# Patient Record
Sex: Female | Born: 1990 | Race: White | Hispanic: No | Smoking: Current every day smoker
Health system: Southern US, Community
[De-identification: ages and names within clinical notes are randomized; demographics above are authoritative.]

## PROBLEM LIST (undated history)

## (undated) DIAGNOSIS — O24419 Gestational diabetes mellitus in pregnancy, unspecified control: Secondary | ICD-10-CM

## (undated) DIAGNOSIS — Z87440 Personal history of urinary (tract) infections: Secondary | ICD-10-CM

## (undated) HISTORY — PX: OTHER SURGICAL HISTORY: SHX169

## (undated) HISTORY — DX: Personal history of urinary (tract) infections: Z87.440

## (undated) HISTORY — DX: Gestational diabetes mellitus in pregnancy, unspecified control: O24.419

---

## 2012-05-09 ENCOUNTER — Emergency Department: Payer: Self-pay | Admitting: *Deleted

## 2012-05-09 LAB — URINALYSIS, COMPLETE
Blood: NEGATIVE
Glucose,UR: NEGATIVE mg/dL (ref 0–75)
Ph: 5 (ref 4.5–8.0)
Protein: NEGATIVE
RBC,UR: 1 /HPF (ref 0–5)
Specific Gravity: 1.029 (ref 1.003–1.030)
Squamous Epithelial: 11

## 2012-05-09 LAB — PREGNANCY, URINE: Pregnancy Test, Urine: NEGATIVE m[IU]/mL

## 2012-05-09 LAB — CBC
MCHC: 33.9 g/dL (ref 32.0–36.0)
MCV: 85 fL (ref 80–100)
RDW: 12.9 % (ref 11.5–14.5)

## 2012-05-09 LAB — COMPREHENSIVE METABOLIC PANEL
Albumin: 3.9 g/dL (ref 3.4–5.0)
BUN: 12 mg/dL (ref 7–18)
Bilirubin,Total: 0.2 mg/dL (ref 0.2–1.0)
Calcium, Total: 8.8 mg/dL (ref 8.5–10.1)
Chloride: 105 mmol/L (ref 98–107)
Creatinine: 0.81 mg/dL (ref 0.60–1.30)
EGFR (African American): >60
Glucose: 74 mg/dL (ref 65–99)
Osmolality: 280 (ref 275–301)
SGPT (ALT): 17 U/L
Sodium: 141 mmol/L (ref 136–145)

## 2013-04-13 ENCOUNTER — Emergency Department: Payer: Self-pay | Admitting: Emergency Medicine

## 2013-04-13 LAB — CBC
HCT: 40.5 % (ref 35.0–47.0)
HGB: 14 g/dL (ref 12.0–16.0)
MCHC: 34.6 g/dL (ref 32.0–36.0)
Platelet: 210 10*3/uL (ref 150–440)
RBC: 4.73 10*6/uL (ref 3.80–5.20)
RDW: 13.4 % (ref 11.5–14.5)
WBC: 9.4 10*3/uL (ref 3.6–11.0)

## 2013-04-13 LAB — COMPREHENSIVE METABOLIC PANEL
Albumin: 3.9 g/dL (ref 3.4–5.0)
Anion Gap: 7 (ref 7–16)
BUN: 10 mg/dL (ref 7–18)
Bilirubin,Total: 0.4 mg/dL (ref 0.2–1.0)
Chloride: 106 mmol/L (ref 98–107)
Co2: 26 mmol/L (ref 21–32)
EGFR (African American): >60
EGFR (Non-African Amer.): >60
Glucose: 94 mg/dL (ref 65–99)
Osmolality: 276 (ref 275–301)
SGOT(AST): 11 U/L — ABNORMAL LOW (ref 15–37)
Total Protein: 7.6 g/dL (ref 6.4–8.2)

## 2013-04-13 LAB — URINALYSIS, COMPLETE
Bilirubin,UR: NEGATIVE
Ketone: NEGATIVE
Nitrite: NEGATIVE
RBC,UR: 2 /HPF (ref 0–5)
Squamous Epithelial: 8

## 2013-06-26 IMAGING — CR DG CHEST 2V
1 series · 2 of 2 positions shown · non-contrast
Comparison: none

REASON FOR EXAM: cough, wheezing
COMMENTS:

PROCEDURE:     DXR - DXR CHEST PA (OR AP) AND LATERAL  - May 09, 2012  [DATE]
RESULT:     Comparison: None

[Series 1: w chest pa · 0.14mm/px · 2 of 2 slices shown]
[im 1/2]
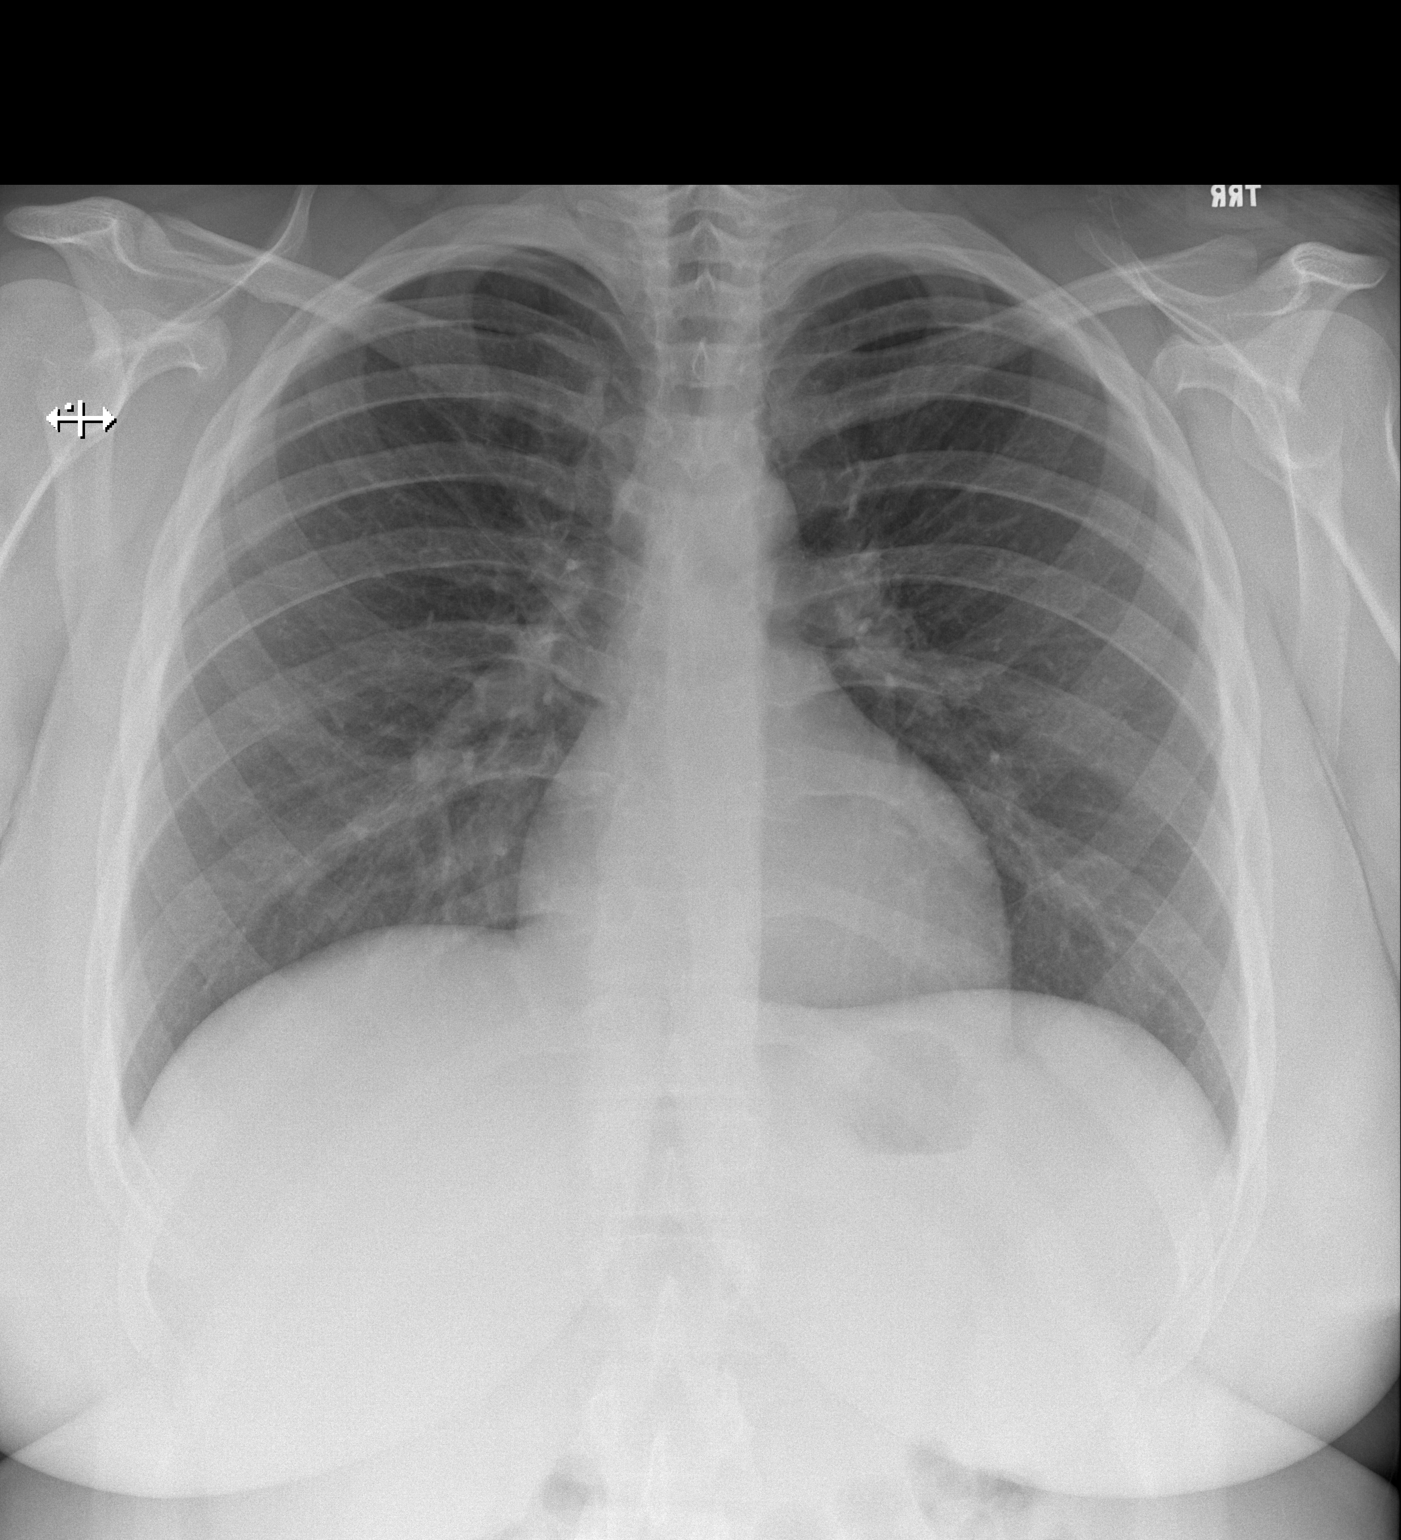
[im 2/2]
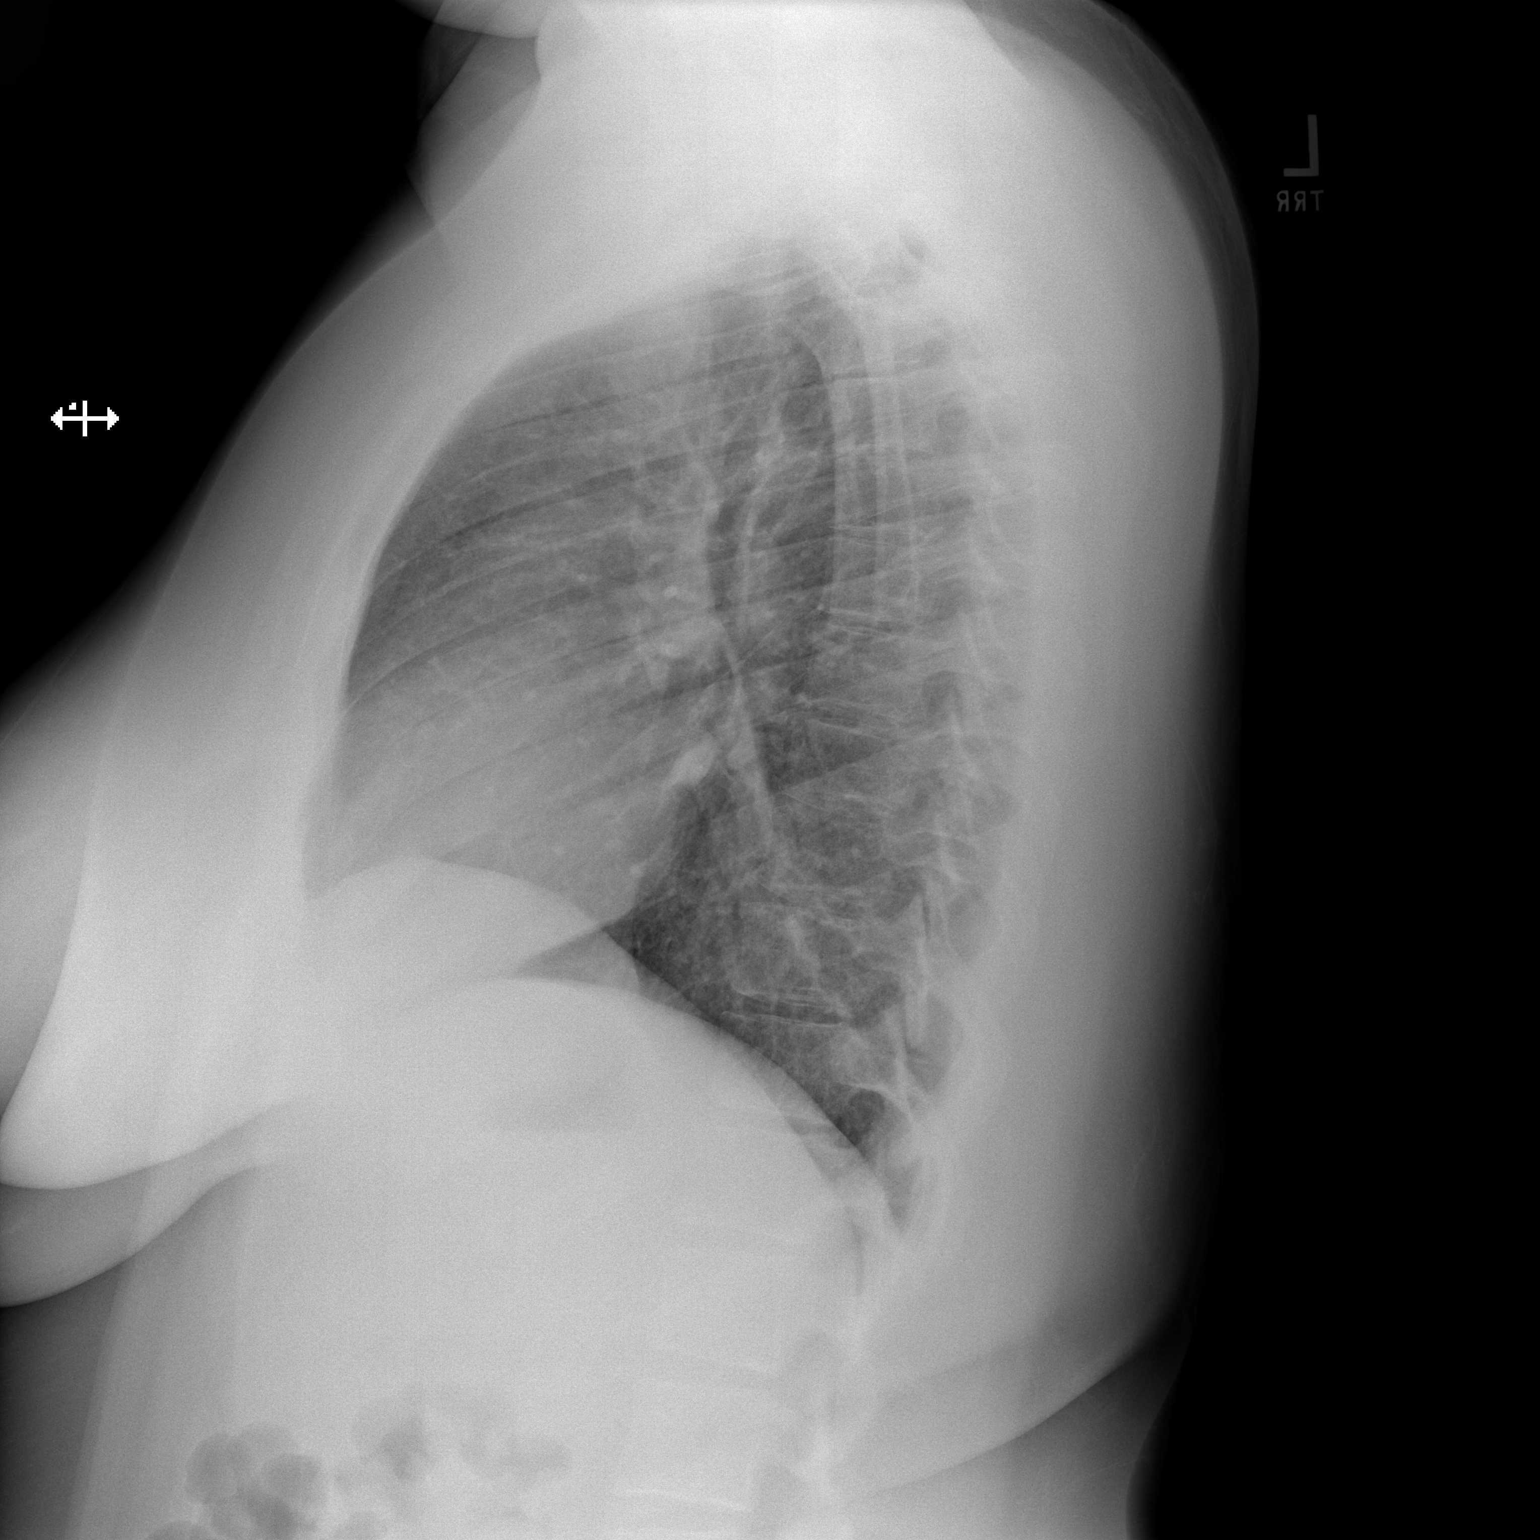

[2 of 2 positions shown; findings below may reference images not displayed]

FINDINGS: PA and lateral chest radiographs are provided.  There is no focal
parenchymal opacity, pleural effusion, or pneumothorax. The heart and
mediastinum are unremarkable.  The osseous structures are unremarkable.
IMPRESSION: No acute disease of the che[REDACTED]

## 2019-11-30 ENCOUNTER — Emergency Department
Admission: EM | Admit: 2019-11-30 | Discharge: 2019-11-30 | Disposition: A | Discharge status: Home / Self Care | Payer: Self-pay | Attending: Emergency Medicine | Admitting: Emergency Medicine

## 2019-11-30 ENCOUNTER — Other Ambulatory Visit: Payer: Self-pay

## 2019-11-30 ENCOUNTER — Encounter: Payer: Self-pay | Admitting: Emergency Medicine

## 2019-11-30 ENCOUNTER — Emergency Department: Payer: Self-pay

## 2019-11-30 DIAGNOSIS — F17210 Nicotine dependence, cigarettes, uncomplicated: Secondary | ICD-10-CM | POA: Insufficient documentation

## 2019-11-30 DIAGNOSIS — M25561 Pain in right knee: Secondary | ICD-10-CM

## 2019-11-30 MED ORDER — KETOROLAC TROMETHAMINE 30 MG/ML IJ SOLN
30.0000 mg | Freq: Once | INTRAMUSCULAR | Status: AC
  Administered 2019-11-30: 30 mg via INTRAMUSCULAR
  Filled 2019-11-30: qty 1

## 2019-11-30 MED ORDER — IBUPROFEN 600 MG PO TABS: 600.0000 mg | ORAL_TABLET | Freq: Four times a day (QID) | ORAL | 0 refills | Status: DC | PRN

## 2019-11-30 NOTE — ED Notes (Signed)
No reaction noted to the injection site. 

## 2019-11-30 NOTE — ED Notes (Signed)
Pt states she is a smoker and high bp runs in the family, but she does not take bp meds.

## 2019-11-30 NOTE — ED Notes (Signed)
Pt states she fell on right  knee years ago but this year it is often swelling, now right knee has been swollen for a month. No redness noted, some swelling noted to right knee. Pt able to walk and move freely in room

## 2019-11-30 NOTE — ED Provider Notes (Signed)
[facility] Emergency Department Provider Note  ____________________________________________  Time seen: Approximately 1:28 PM  I have reviewed the triage vital signs and the nursing notes.   HISTORY  Chief Complaint Knee Pain    HPI Kirsten Hurley is a 28 y.o. female that presents to the emergency department for evaluation of right knee pain for about 1 month.  Patient states that she has been on her feet more than usual for the last month due to the holidays.  Patient works in [employer] and is on her feet at work all day.  Patient has not followed up with anyone prior to now because she has had difficulty getting off work to make an appointment somewhere.  She was able to convince her manager to give her off today to come to the ER to be evaluated.  No trauma.  History reviewed. No pertinent past medical history.  There are no problems to display for this patient.   History reviewed. No pertinent surgical history.  Prior to Admission medications   Medication Sig Start Date End Date Taking? Authorizing Provider  ibuprofen (ADVIL) 600 MG tablet Take 1 tablet (600 mg total) by mouth every 6 (six) hours as needed. 11/30/19   Enid Derry, PA-C    Allergies Patient has no known allergies.  No family history on file.  Social History Social History   Tobacco Use  . Smoking status: Current Every Day Smoker    Types: Cigarettes  . Smokeless tobacco: Never Used  Substance Use Topics  . Alcohol use: Not on file  . Drug use: Not on file     Review of Systems  Constitutional: No fever/chills Gastrointestinal:  No nausea, no vomiting.  Musculoskeletal: Positive for knee pain. Skin: Negative for rash, abrasions, lacerations, ecchymosis. Neurological: Negative for numbness or tingling   ____________________________________________   PHYSICAL EXAM:  VITAL SIGNS: ED Triage Vitals  Enc Vitals Group     BP 11/30/19 1317 (!) 149/96     Pulse Rate  11/30/19 1317 (!) 58     Resp 11/30/19 1317 16     Temp 11/30/19 1317 98.3 F (36.8 C)     Temp Source 11/30/19 1317 Oral     SpO2 11/30/19 1317 100 %     Weight 11/30/19 1234 180 lb (81.6 kg)     Height 11/30/19 1234 5\' 5"  (1.651 m)     Head Circumference --      Peak Flow --      Pain Score 11/30/19 1234 8     Pain Loc --      Pain Edu? --      Excl. in [place]? --      Constitutional: Alert and oriented. Well appearing and in no acute distress. Eyes: Conjunctivae are normal. PERRL. EOMI. Head: Atraumatic. ENT:      Ears:      Nose: No congestion/rhinnorhea.      Mouth/Throat: Mucous membranes are moist.  Neck: No stridor.  Cardiovascular: Normal rate, regular rhythm.  Good peripheral circulation. Respiratory: Normal respiratory effort without tachypnea or retractions. Lungs CTAB. Good air entry to the bases with no decreased or absent breath sounds. Musculoskeletal: Full range of motion to all extremities. No gross deformities appreciated.  Full range of motion of right knee.  Mild swelling to anterior knee without any overlying erythema or ecchymosis. No calf tenderness. Neurologic:  Normal speech and language. No gross focal neurologic deficits are appreciated.  Skin:  Skin is warm, dry and  intact. No rash noted. Psychiatric: Mood and affect are normal. Speech and behavior are normal. Patient exhibits appropriate insight and judgement.   ____________________________________________   LABS (all labs ordered are listed, but only abnormal results are displayed)  Labs Reviewed - No data to display ____________________________________________  EKG   ____________________________________________  RADIOLOGY Robinette Haines, personally viewed and evaluated these images (plain radiographs) as part of my medical decision making, as well as reviewing the written report by the radiologist.  DG Knee Complete 4 Views Right  Result Date: 11/30/2019 CLINICAL DATA:  RIGHT knee  swelling, remote trauma EXAM: RIGHT KNEE - COMPLETE 4+ VIEW COMPARISON:  None FINDINGS: Osseous mineralization normal. Joint spaces preserved. No fracture, dislocation, or bone destruction. No joint effusion. IMPRESSION: Normal exam. Electronically Signed   By: Lavonia Dana M.D.   On: 11/30/2019 13:22    ____________________________________________    PROCEDURES  Procedure(s) performed:    Procedures    Medications  ketorolac (TORADOL) 30 MG/ML injection 30 mg (30 mg Intramuscular Given 11/30/19 1416)     ____________________________________________   INITIAL IMPRESSION / ASSESSMENT AND PLAN / ED COURSE  Pertinent labs & imaging results that were available during my care of the patient were reviewed by me and considered in my medical decision making (see chart for details).  Review of the [place] CSRS was performed in accordance of the [place] prior to dispensing any controlled drugs.   Patient presents emergency department for evaluation of right knee pain for 1 month.  Vital signs and exam are reassuring.  Knee x-ray negative for acute bony abnormalities.  IM Toradol was given for pain and inflammation.  Knee was Ace wrapped.  Patient declines Crutches.  Patient will be discharged home with prescriptions for ibuprofen. Patient is to follow up with orthopedics as directed. Patient is given ED precautions to return to the ED for any worsening or new symptoms.   Kirsten Hurley was evaluated in Emergency Department on 11/30/2019 for the symptoms described in the history of present illness. She was evaluated in the context of the global COVID-19 pandemic, which necessitated consideration that the patient might be at risk for infection with the SARS-CoV-2 virus that causes COVID-19. Institutional protocols and algorithms that pertain to the evaluation of patients at risk for COVID-19 are in a state of rapid change based on information released by regulatory bodies including the CDC and federal  and state organizations. These policies and algorithms were followed during the patient's care in the ED.  ____________________________________________  FINAL CLINICAL IMPRESSION(S) / ED DIAGNOSES  Final diagnoses:  Acute pain of right knee      NEW MEDICATIONS STARTED DURING THIS VISIT:  ED Discharge Orders         Ordered    ibuprofen (ADVIL) 600 MG tablet  Every 6 hours PRN     11/30/19 1421              This chart was dictated using voice recognition software/Dragon. Despite best efforts to proofread, errors can occur which can change the meaning. Any change was purely unintentional.    Laban Emperor, PA-C 11/30/19 1601    Duffy Bruce, MD 12/02/19 1036

## 2019-11-30 NOTE — ED Triage Notes (Signed)
Right knee pain x 1 month.  AAOx3.  Skin warm and dry.  Ambulates with easy and steady gait.  NAD

## 2019-12-05 DIAGNOSIS — Z8759 Personal history of other complications of pregnancy, childbirth and the puerperium: Secondary | ICD-10-CM

## 2019-12-05 HISTORY — DX: Personal history of other complications of pregnancy, childbirth and the puerperium: Z87.59

## 2019-12-21 ENCOUNTER — Emergency Department: Payer: Self-pay

## 2019-12-21 ENCOUNTER — Other Ambulatory Visit: Payer: Self-pay

## 2019-12-21 ENCOUNTER — Emergency Department
Admission: EM | Admit: 2019-12-21 | Discharge: 2019-12-21 | Disposition: A | Discharge status: Home / Self Care | Payer: Self-pay | Attending: Emergency Medicine | Admitting: Emergency Medicine

## 2019-12-21 ENCOUNTER — Encounter: Payer: Self-pay | Admitting: Emergency Medicine

## 2019-12-21 DIAGNOSIS — N939 Abnormal uterine and vaginal bleeding, unspecified: Secondary | ICD-10-CM

## 2019-12-21 DIAGNOSIS — R102 Pelvic and perineal pain: Secondary | ICD-10-CM | POA: Insufficient documentation

## 2019-12-21 DIAGNOSIS — F17210 Nicotine dependence, cigarettes, uncomplicated: Secondary | ICD-10-CM | POA: Insufficient documentation

## 2019-12-21 DIAGNOSIS — O039 Complete or unspecified spontaneous abortion without complication: Secondary | ICD-10-CM | POA: Insufficient documentation

## 2019-12-21 LAB — CBC WITH DIFFERENTIAL/PLATELET
Abs Immature Granulocytes: 0.04 10*3/uL (ref 0.00–0.07)
Basophils Absolute: 0 10*3/uL (ref 0.0–0.1)
Basophils Relative: 0 %
Eosinophils Absolute: 0.1 10*3/uL (ref 0.0–0.5)
Eosinophils Relative: 1 %
HCT: 33.8 % — ABNORMAL LOW (ref 36.0–46.0)
Hemoglobin: 11.2 g/dL — ABNORMAL LOW (ref 12.0–15.0)
Immature Granulocytes: 0 %
Lymphocytes Relative: 22 %
Lymphs Abs: 2.3 10*3/uL (ref 0.7–4.0)
MCH: 28.9 pg (ref 26.0–34.0)
MCHC: 33.1 g/dL (ref 30.0–36.0)
MCV: 87.3 fL (ref 80.0–100.0)
Monocytes Absolute: 0.5 10*3/uL (ref 0.1–1.0)
Monocytes Relative: 5 %
Neutro Abs: 7.3 10*3/uL (ref 1.7–7.7)
Neutrophils Relative %: 72 %
Platelets: 242 10*3/uL (ref 150–400)
RBC: 3.87 MIL/uL (ref 3.87–5.11)
RDW: 13.2 % (ref 11.5–15.5)
WBC: 10.3 10*3/uL (ref 4.0–10.5)
nRBC: 0 % (ref 0.0–0.2)

## 2019-12-21 LAB — COMPREHENSIVE METABOLIC PANEL
ALT: 15 U/L (ref 0–44)
AST: 14 U/L — ABNORMAL LOW (ref 15–41)
Albumin: 4.1 g/dL (ref 3.5–5.0)
Alkaline Phosphatase: 41 U/L (ref 38–126)
Anion gap: 6 (ref 5–15)
BUN: 17 mg/dL (ref 6–20)
CO2: 25 mmol/L (ref 22–32)
Calcium: 8.6 mg/dL — ABNORMAL LOW (ref 8.9–10.3)
Chloride: 106 mmol/L (ref 98–111)
Creatinine, Ser: 0.58 mg/dL (ref 0.44–1.00)
GFR calc Af Amer: >60 mL/min (ref >60)
GFR calc non Af Amer: >60 mL/min (ref >60)
Glucose, Bld: 102 mg/dL — ABNORMAL HIGH (ref 70–99)
Potassium: 4.2 mmol/L (ref 3.5–5.1)
Sodium: 137 mmol/L (ref 135–145)
Total Bilirubin: 0.6 mg/dL (ref 0.3–1.2)
Total Protein: 7.2 g/dL (ref 6.5–8.1)

## 2019-12-21 LAB — ABO/RH: ABO/RH(D): A NEG

## 2019-12-21 LAB — HCG, QUANTITATIVE, PREGNANCY: hCG, Beta Chain, Quant, S: 3382 m[IU]/mL — ABNORMAL HIGH (ref <5)

## 2019-12-21 NOTE — ED Notes (Signed)
Asked pt if she felt like she could provide a urine sample and she said that she could not.

## 2019-12-21 NOTE — ED Provider Notes (Signed)
[facility] Emergency Department Provider Note  ____________________________________________  Time seen: Approximately 2:13 PM  I have reviewed the triage vital signs and the nursing notes.   HISTORY  Chief Complaint Vaginal Bleeding    HPI Kirsten Hurley is a 29 y.o. female who presents the emergency department for evaluation of pelvic pain, vaginal bleeding.  Patient reports that her vaginal bleeding started December 23, almost a month ago.  Patient states that she had some light spotting at the time of her normal menstrual cycle.  This is turned into daily bleeding over the past month.  Patient states that the bleeding became heavy and she was passing large clots starting yesterday.  She has had associated lower pelvic pain.  She denies any chance of pregnancy.  No concerns for STD.  Patient denies any urinary symptoms or flank pain.  No fevers or chills.  No other complaints at this time.  No history of same.  She is not on any birth control.  No anticoagulation use.         History reviewed. No pertinent past medical history.  There are no problems to display for this patient.   History reviewed. No pertinent surgical history.  Prior to Admission medications   Medication Sig Start Date End Date Taking? Authorizing Provider  ibuprofen (ADVIL) 600 MG tablet Take 1 tablet (600 mg total) by mouth every 6 (six) hours as needed. 11/30/19   Laban Emperor, PA-C    Allergies Patient has no known allergies.  No family history on file.  Social History Social History   Tobacco Use  . Smoking status: Current Every Day Smoker    Types: Cigarettes  . Smokeless tobacco: Never Used  Substance Use Topics  . Alcohol use: Not on file  . Drug use: Not on file     Review of Systems  Constitutional: No fever/chills Eyes: No visual changes. No discharge ENT: No upper respiratory complaints. Cardiovascular: no chest pain. Respiratory: no cough. No  SOB. Gastrointestinal: No abdominal pain.  No nausea, no vomiting.  No diarrhea.  No constipation. Genitourinary: Negative for dysuria. No hematuria.  Positive for vaginal bleeding, pelvic pain Musculoskeletal: Negative for musculoskeletal pain. Skin: Negative for rash, abrasions, lacerations, ecchymosis. Neurological: Negative for headaches, focal weakness or numbness. 10-point ROS otherwise negative.  ____________________________________________   PHYSICAL EXAM:  VITAL SIGNS: ED Triage Vitals  Enc Vitals Group     BP 12/21/19 1246 (!) 143/88     Pulse Rate 12/21/19 1246 (!) 103     Resp 12/21/19 1246 16     Temp 12/21/19 1246 97.8 F (36.6 C)     Temp Source 12/21/19 1246 Oral     SpO2 12/21/19 1246 100 %     Weight 12/21/19 1247 230 lb (104.3 kg)     Height 12/21/19 1247 5\' 6"  (1.676 m)     Head Circumference --      Peak Flow --      Pain Score 12/21/19 1246 0     Pain Loc --      Pain Edu? --      Excl. in [place]? --      Constitutional: Alert and oriented. Well appearing and in no acute distress. Eyes: Conjunctivae are normal. PERRL. EOMI. Head: Atraumatic. ENT:      Ears:       Nose: No congestion/rhinnorhea.      Mouth/Throat: Mucous membranes are moist.  Neck: No stridor.    Cardiovascular: Normal rate, regular  rhythm. Normal S1 and S2.  Good peripheral circulation. Respiratory: Normal respiratory effort without tachypnea or retractions. Lungs CTAB. Good air entry to the bases with no decreased or absent breath sounds. Gastrointestinal: No visible external abdominal wall findings.  Bowel sounds 4 quadrants. Soft and nontender to palpation over the 4 quadrants.  Patient does have very mild tenderness right above the suprapubic region.. No guarding or rigidity. No palpable masses. No distention. No CVA tenderness. Musculoskeletal: Full range of motion to all extremities. No gross deformities appreciated. Neurologic:  Normal speech and language. No gross focal  neurologic deficits are appreciated.  Skin:  Skin is warm, dry and intact. No rash noted. Psychiatric: Mood and affect are normal. Speech and behavior are normal. Patient exhibits appropriate insight and judgement.   ____________________________________________   LABS (all labs ordered are listed, but only abnormal results are displayed)  Labs Reviewed  CBC WITH DIFFERENTIAL/PLATELET - Abnormal; Notable for the following components:      Result Value   Hemoglobin 11.2 (*)    HCT 33.8 (*)    All other components within normal limits  COMPREHENSIVE METABOLIC PANEL - Abnormal; Notable for the following components:   Glucose, Bld 102 (*)    Calcium 8.6 (*)    AST 14 (*)    All other components within normal limits  HCG, QUANTITATIVE, PREGNANCY - Abnormal; Notable for the following components:   hCG, Beta Chain, Quant, S 3,382 (*)    All other components within normal limits  URINALYSIS, COMPLETE (UACMP) WITH MICROSCOPIC  POC URINE PREG, ED  TYPE AND SCREEN  ABO/RH   ____________________________________________  EKG   ____________________________________________  RADIOLOGY I personally viewed and evaluated these images as part of my medical decision making, as well as reviewing the written report by the radiologist.  I discussed the case with radiologist via telephone.  Appears the patient is experiencing an active miscarriage.  US OB Comp Less 14 Wks  Result Date: 12/21/2019 CLINICAL DATA:  Pelvic pain, vaginal bleeding, positive beta HCG EXAM: OBSTETRIC <14 WK ULTRASOUND TECHNIQUE: Transabdominal ultrasound was performed for evaluation of the gestation as well as the maternal uterus and adnexal regions. COMPARISON:  None. FINDINGS: Intrauterine gestational sac: None Yolk sac:  Visualized. Embryo:  Not Visualized. Cardiac Activity: Not Visualized. Subchorionic hemorrhage:  None visualized. Maternal uterus/adnexae: The endocervical canal is dilated with extensive heterogeneous  material in the cervical canal and upper vagina. No internal vascularity is seen within this area on color Doppler. Endometrial thickness measures 4.7 mm, although evaluation is limited as only transabdominal ultrasound was performed. Right ovary measures 3.2 x 2.2 x 2.8 cm appears unremarkable. Left ovary measures 3.5 x 2.6 x 2.3 cm and appears unremarkable. No adnexal masses or free fluid. IMPRESSION: 1. No intrauterine pregnancy. 2. Dilated endocervical canal with heterogeneous debris present at the level of the cervix and upper vaginal canal. Findings suggestive of failing first-trimester pregnancy in progress. Recommend serial beta hCG and short-term follow-up ultrasound to assess for any retained products. 3. Unremarkable appearance of the ovaries and adnexal regions. These results were called by telephone at the time of interpretation on 12/21/2019 at 4:31 pm to provider [facility] , who verbally acknowledged these results. Electronically Signed   By: Duanne Guess D.O.   On: 12/21/2019 16:32    ____________________________________________    PROCEDURES  Procedure(s) performed:    Procedures    Medications - No data to display   ____________________________________________   INITIAL IMPRESSION / ASSESSMENT AND PLAN /  ED COURSE  Pertinent labs & imaging results that were available during my care of the patient were reviewed by me and considered in my medical decision making (see chart for details).  Review of the [place] CSRS was performed in accordance of the [facility] prior to dispensing any controlled drugs.           Patient's diagnosis is consistent with miscarriage.  Patient presented to emergency department complaining of vaginal bleeding times several weeks.  Differential included ovarian cyst, higher than normal menstrual cycle, uterine lesion mass, STD, pregnancy, miscarriage.  Bleeding became much heavier today and she had passed "clots" today.  Patient had slight  tenderness in the suprapubic region.  Labs were reassuring.  hCG was positive.  Ultrasound reveals evidence of active miscarriage.  I have informed patient of results, patient will need to have repeat hCG, repeat ultrasound as an outpatient.  Follow-up with OB/GYN. Patient is given ED precautions to return to the ED for any worsening or new symptoms.     ____________________________________________  FINAL CLINICAL IMPRESSION(S) / ED DIAGNOSES  Final diagnoses:  Vaginal bleeding  Pelvic pain  Miscarriage      NEW MEDICATIONS STARTED DURING THIS VISIT:  ED Discharge Orders    None          This chart was dictated using voice recognition software/Dragon. Despite best efforts to proofread, errors can occur which can change the meaning. Any change was purely unintentional.    Racheal Patches, PA-C 12/21/19 1657    Phineas Semen, MD 12/22/19 [phone]

## 2019-12-21 NOTE — ED Triage Notes (Signed)
C/O vaginal bleeding and clotting x 1 month.  AAOx3.  Skin warm and dry. NAD

## 2019-12-30 ENCOUNTER — Encounter: Payer: Self-pay | Admitting: Emergency Medicine

## 2019-12-30 ENCOUNTER — Other Ambulatory Visit: Payer: Self-pay

## 2019-12-30 ENCOUNTER — Emergency Department: Admission: EM | Admit: 2019-12-30 | Discharge: 2019-12-30 | Discharge status: Absent without leave | Payer: Self-pay

## 2019-12-30 NOTE — ED Triage Notes (Signed)
Pt reports was here last week for a miscarriage and was advised to come back today for a repeat US. Pt denies sx's currently.

## 2020-09-28 ENCOUNTER — Other Ambulatory Visit: Payer: Self-pay

## 2020-09-28 ENCOUNTER — Encounter: Payer: Self-pay | Admitting: *Deleted

## 2020-09-28 ENCOUNTER — Emergency Department
Admission: EM | Admit: 2020-09-28 | Discharge: 2020-09-28 | Disposition: A | Discharge status: Home / Self Care | Payer: Self-pay | Attending: Emergency Medicine | Admitting: Emergency Medicine

## 2020-09-28 DIAGNOSIS — F17210 Nicotine dependence, cigarettes, uncomplicated: Secondary | ICD-10-CM | POA: Insufficient documentation

## 2020-09-28 DIAGNOSIS — N970 Female infertility associated with anovulation: Secondary | ICD-10-CM | POA: Insufficient documentation

## 2020-09-28 DIAGNOSIS — B9689 Other specified bacterial agents as the cause of diseases classified elsewhere: Secondary | ICD-10-CM | POA: Insufficient documentation

## 2020-09-28 DIAGNOSIS — R5383 Other fatigue: Secondary | ICD-10-CM | POA: Insufficient documentation

## 2020-09-28 DIAGNOSIS — N760 Acute vaginitis: Secondary | ICD-10-CM | POA: Insufficient documentation

## 2020-09-28 LAB — CBC WITH DIFFERENTIAL/PLATELET
Abs Immature Granulocytes: 0.02 10*3/uL (ref 0.00–0.07)
Basophils Absolute: 0 10*3/uL (ref 0.0–0.1)
Basophils Relative: 0 %
Eosinophils Absolute: 0.2 10*3/uL (ref 0.0–0.5)
Eosinophils Relative: 2 %
HCT: 41.7 % (ref 36.0–46.0)
Hemoglobin: 13.1 g/dL (ref 12.0–15.0)
Immature Granulocytes: 0 %
Lymphocytes Relative: 24 %
Lymphs Abs: 1.7 10*3/uL (ref 0.7–4.0)
MCH: 25.1 pg — ABNORMAL LOW (ref 26.0–34.0)
MCHC: 31.4 g/dL (ref 30.0–36.0)
MCV: 80 fL (ref 80.0–100.0)
Monocytes Absolute: 0.4 10*3/uL (ref 0.1–1.0)
Monocytes Relative: 6 %
Neutro Abs: 4.9 10*3/uL (ref 1.7–7.7)
Neutrophils Relative %: 68 %
Platelets: 292 10*3/uL (ref 150–400)
RBC: 5.21 MIL/uL — ABNORMAL HIGH (ref 3.87–5.11)
RDW: 15.2 % (ref 11.5–15.5)
WBC: 7.1 10*3/uL (ref 4.0–10.5)
nRBC: 0 % (ref 0.0–0.2)

## 2020-09-28 LAB — COMPREHENSIVE METABOLIC PANEL
ALT: 15 U/L (ref 0–44)
AST: 15 U/L (ref 15–41)
Albumin: 4.5 g/dL (ref 3.5–5.0)
Alkaline Phosphatase: 51 U/L (ref 38–126)
Anion gap: 8 (ref 5–15)
BUN: 13 mg/dL (ref 6–20)
CO2: 25 mmol/L (ref 22–32)
Calcium: 9.4 mg/dL (ref 8.9–10.3)
Chloride: 103 mmol/L (ref 98–111)
Creatinine, Ser: 0.72 mg/dL (ref 0.44–1.00)
GFR, Estimated: >60 mL/min (ref >60)
Glucose, Bld: 88 mg/dL (ref 70–99)
Potassium: 4.2 mmol/L (ref 3.5–5.1)
Sodium: 136 mmol/L (ref 135–145)
Total Bilirubin: 0.6 mg/dL (ref 0.3–1.2)
Total Protein: 8.5 g/dL — ABNORMAL HIGH (ref 6.5–8.1)

## 2020-09-28 LAB — URINALYSIS, COMPLETE (UACMP) WITH MICROSCOPIC
Bilirubin Urine: NEGATIVE
Glucose, UA: NEGATIVE mg/dL
Ketones, ur: NEGATIVE mg/dL
Leukocytes,Ua: NEGATIVE
Nitrite: NEGATIVE
Protein, ur: NEGATIVE mg/dL
Specific Gravity, Urine: 1.004 — ABNORMAL LOW (ref 1.005–1.030)
pH: 6 (ref 5.0–8.0)

## 2020-09-28 LAB — WET PREP, GENITAL
Sperm: NONE SEEN
Trich, Wet Prep: NONE SEEN
Yeast Wet Prep HPF POC: NONE SEEN

## 2020-09-28 LAB — CHLAMYDIA/NGC RT PCR (ARMC ONLY)
Chlamydia Tr: NOT DETECTED
N gonorrhoeae: NOT DETECTED

## 2020-09-28 LAB — POCT PREGNANCY, URINE: Preg Test, Ur: NEGATIVE

## 2020-09-28 MED ORDER — IBUPROFEN 600 MG PO TABS: 600.0000 mg | ORAL_TABLET | Freq: Three times a day (TID) | ORAL | 0 refills | Status: DC | PRN

## 2020-09-28 MED ORDER — MEDROXYPROGESTERONE ACETATE 5 MG PO TABS: 5.0000 mg | ORAL_TABLET | Freq: Every day | ORAL | 0 refills | Status: DC

## 2020-09-28 MED ORDER — METRONIDAZOLE 500 MG PO TABS: 500.0000 mg | ORAL_TABLET | Freq: Two times a day (BID) | ORAL | 0 refills | Status: AC

## 2020-09-28 NOTE — ED Notes (Signed)
poct pregnancy Negative 

## 2020-09-28 NOTE — Discharge Instructions (Addendum)
Start the provera immediately. You should stop bleeding within 1-2 days. At the end of the 10 day course, you should have  a normal to slightly heavy period.  Take the motrin as needed for cramping  Your wet prep test did show some clue cells, which can be from bacterial vaginosis. This is a common condition that is NOT related to sexual intercourse or hygiene. Take the antibiotic as prescribed.

## 2020-09-28 NOTE — ED Provider Notes (Signed)
[facility] Emergency Department Provider Note  ____________________________________________   First MD Initiated Contact with Patient 09/28/20 1802     (approximate)  I have reviewed the triage vital signs and the nursing notes.   HISTORY  Chief Complaint Vaginal Bleeding    HPI Kirsten Hurley is a 29 y.o. female  Here with vaginal bleeding. Pt reports that over hte pat 2 months, she has had irregular periods/bleeding. She is normally very regular, every month, with 5-7 days of bleeding. She had a normal period 2 months ago, but since then has had essentially three episodes of bleeding. She had what she describes as a normal period that ended one week ago most recently. However, over the past 2 days, she has begun "spotting" again with light bleeding. She has had associated mild fatigue but no overt abdominal pain. No dysuria, hematuria. She is sexually active with males only but denies any dyspareunia, no discharge. No other complaints. Does admit to recent increased stress but no hormone treatment, no other med changes. No significant weight gain or loss.        No past medical history on file.  There are no problems to display for this patient.   No past surgical history on file.  Prior to Admission medications   Medication Sig Start Date End Date Taking? Authorizing Provider  ibuprofen (ADVIL) 600 MG tablet Take 1 tablet (600 mg total) by mouth every 8 (eight) hours as needed for moderate pain or cramping. 09/28/20   Shaune Pollack, MD  medroxyPROGESTERone (PROVERA) 5 MG tablet Take 1 tablet (5 mg total) by mouth daily for 10 days. 09/28/20 10/08/20  Shaune Pollack, MD  metroNIDAZOLE (FLAGYL) 500 MG tablet Take 1 tablet (500 mg total) by mouth 2 (two) times daily for 7 days. 09/28/20 10/05/20  Shaune Pollack, MD    Allergies Patient has no known allergies.  No family history on file.  Social History Social History   Tobacco Use  . Smoking  status: Current Every Day Smoker    Types: Cigarettes  . Smokeless tobacco: Never Used  Substance Use Topics  . Alcohol use: Yes  . Drug use: Not Currently    Review of Systems  Review of Systems  Constitutional: Negative for fatigue and fever.  HENT: Negative for congestion and sore throat.   Eyes: Negative for visual disturbance.  Respiratory: Negative for cough and shortness of breath.   Cardiovascular: Negative for chest pain.  Gastrointestinal: Negative for abdominal pain, diarrhea, nausea and vomiting.  Genitourinary: Positive for vaginal bleeding. Negative for flank pain.  Musculoskeletal: Negative for back pain and neck pain.  Skin: Negative for rash and wound.  Neurological: Negative for weakness.     ____________________________________________  PHYSICAL EXAM:      VITAL SIGNS: ED Triage Vitals [09/28/20 1517]  Enc Vitals Group     BP (!) 140/95     Pulse Rate 84     Resp 20     Temp 98.1 F (36.7 C)     Temp Source Oral     SpO2 100 %     Weight 250 lb (113.4 kg)     Height 5\' 7"  (1.702 m)     Head Circumference      Peak Flow      Pain Score 0     Pain Loc      Pain Edu?      Excl. in GC?      Physical Exam Vitals and nursing  note reviewed.  Constitutional:      General: She is not in acute distress.    Appearance: She is well-developed.  HENT:     Head: Normocephalic and atraumatic.  Eyes:     Conjunctiva/sclera: Conjunctivae normal.  Cardiovascular:     Rate and Rhythm: Normal rate and regular rhythm.     Heart sounds: Normal heart sounds. No murmur heard.  No friction rub.  Pulmonary:     Effort: Pulmonary effort is normal. No respiratory distress.     Breath sounds: Normal breath sounds. No wheezing or rales.  Abdominal:     General: There is no distension.     Palpations: Abdomen is soft.     Tenderness: There is no abdominal tenderness.  Genitourinary:    Comments: Moderate volume dark red vaginal bleeding. Os closed. No cervical  friability or discharge. No adnexal pain. Musculoskeletal:     Cervical back: Neck supple.  Skin:    General: Skin is warm.     Capillary Refill: Capillary refill takes less than 2 seconds.  Neurological:     Mental Status: She is alert and oriented to person, place, and time.     Motor: No abnormal muscle tone.       ____________________________________________   LABS (all labs ordered are listed, but only abnormal results are displayed)  Labs Reviewed  WET PREP, GENITAL - Abnormal; Notable for the following components:      Result Value   Clue Cells Wet Prep HPF POC PRESENT (*)    WBC, Wet Prep HPF POC MODERATE (*)    All other components within normal limits  CBC WITH DIFFERENTIAL/PLATELET - Abnormal; Notable for the following components:   RBC 5.21 (*)    MCH 25.1 (*)    All other components within normal limits  COMPREHENSIVE METABOLIC PANEL - Abnormal; Notable for the following components:   Total Protein 8.5 (*)    All other components within normal limits  URINALYSIS, COMPLETE (UACMP) WITH MICROSCOPIC - Abnormal; Notable for the following components:   Color, Urine STRAW (*)    APPearance HAZY (*)    Specific Gravity, Urine 1.004 (*)    Hgb urine dipstick LARGE (*)    Bacteria, UA FEW (*)    All other components within normal limits  CHLAMYDIA/NGC RT PCR ([facility] ONLY)  POC URINE PREG, ED  POCT PREGNANCY, URINE    ____________________________________________  EKG:  ________________________________________  RADIOLOGY All imaging, including plain films, CT scans, and ultrasounds, independently reviewed by me, and interpretations confirmed via formal radiology reads.  ED MD interpretation:     Official radiology report(s): No results found.  ____________________________________________  PROCEDURES   Procedure(s) performed (including Critical Care):  Procedures  ____________________________________________  INITIAL IMPRESSION / MDM /  ASSESSMENT AND PLAN / ED COURSE  As part of my medical decision making, I reviewed the following data within the electronic MEDICAL RECORD NUMBER Nursing notes reviewed and incorporated, Old chart reviewed, Notes from prior ED visits, and [place] Controlled Substance Database       *MEMORY HEINRICHS was evaluated in Emergency Department on 09/28/2020 for the symptoms described in the history of present illness. She was evaluated in the context of the global COVID-19 pandemic, which necessitated consideration that the patient might be at risk for infection with the SARS-CoV-2 virus that causes COVID-19. Institutional protocols and algorithms that pertain to the evaluation of patients at risk for COVID-19 are in a state of rapid change based on information released  by regulatory bodies including the CDC and federal and state organizations. These policies and algorithms were followed during the patient's care in the ED.  Some ED evaluations and interventions may be delayed as a result of limited staffing during the pandemic.*     Medical Decision Making:  29 yo F here with irregular vaginal bleeding. Suspect anovulatory bleeding in setting of recent stressors. Hgb is stable, pt HDS with no significant hemorrhage on exam. She is not on anticoagulants. UPT negative. UA without signs of UTI and she has no sx of UTI. Discussed management options with pt. She has tolerated OCPs in past. No h/o DVT/PE. Will place on 10 day course of Provera, refer for outpt GYN follow-up. Of note, wet prep + clue cells - will treat with Flagyl. Return precautions given. Motrin PRN cramping.  ____________________________________________  FINAL CLINICAL IMPRESSION(S) / ED DIAGNOSES  Final diagnoses:  Anovulatory bleeding  Bacterial vaginosis     MEDICATIONS GIVEN DURING THIS VISIT:  Medications - No data to display   ED Discharge Orders         Ordered    ibuprofen (ADVIL) 600 MG tablet  Every 8 hours PRN        09/28/20  1934    medroxyPROGESTERone (PROVERA) 5 MG tablet  Daily        09/28/20 1934    metroNIDAZOLE (FLAGYL) 500 MG tablet  2 times daily        09/28/20 1934           Note:  This document was prepared using Dragon voice recognition software and may include unintentional dictation errors.   Shaune Pollack, MD 09/28/20 [phone]

## 2020-09-28 NOTE — ED Triage Notes (Signed)
Pt reports vag bleeding x 2 days.  No abd pain.  Pt reports menses  1 week ago.  No urinary sx.  Pt alert. Speech clear.

## 2020-12-04 NOTE — L&D Delivery Note (Signed)
Delivery Note  Kirsten Hurley is a G2P0010 at [redacted]w[redacted]d with an LMP of 10/18/20.  First Stage: Labor onset: 07/18/21 @1700  Augmentation: oxytocin and AROM Analgesia /Anesthesia intrapartum: epidural AROM at 0210  Second Stage: Complete dilation at 0210 Onset of pushing at 0236 FHR second stage 125  Delivery of a viable baby boy on 07/19/2021  at 0337 by CNM Delivery of fetal head in OA position with restitution to LOA. no nuchal cord;  Anterior then posterior shoulders delivered easily with gentle downward traction. Baby placed on mom's chest, and attended to by baby RN Cord double clamped after cessation of pulsation, cut by FOB  Cord blood sample collected:Yes Collection of cord blood donation N/A Arterial cord blood sample N/A  Third Stage: Oxytocin bolus started after delivery of infant for hemorrhage prophylaxis  Placenta delivered intact with 3 VC @ 0345 Placenta disposition: discarded Uterine tone firm / bleeding moderate  Bilateral periurethral and 1st degree laceration identified  Anesthesia for repair: lidocaine Repair yes Est. Blood Loss (mL): 500  Complications: none  Mom to postpartum.  Baby to Couplet care / Skin to Skin.  Newborn: Information for the patient's newborn:  Makayleigh, Poliquin Zettie Pho  Live born female  Birth Weight:   APGAR: 8, 9  Newborn Delivery   Birth date/time: 07/19/2021 03:37:00 Delivery type: Vaginal, Spontaneous        Feeding planned: Breast/Bottle  ---------- 07/21/2021, CNM Certified Nurse Midwife [facility]

## 2020-12-16 ENCOUNTER — Other Ambulatory Visit: Payer: Self-pay

## 2020-12-16 ENCOUNTER — Emergency Department
Admission: EM | Admit: 2020-12-16 | Discharge: 2020-12-16 | Disposition: A | Discharge status: Home / Self Care | Payer: Medicaid Other | Attending: Emergency Medicine | Admitting: Emergency Medicine

## 2020-12-16 ENCOUNTER — Emergency Department: Payer: Medicaid Other

## 2020-12-16 DIAGNOSIS — O4690 Antepartum hemorrhage, unspecified, unspecified trimester: Secondary | ICD-10-CM

## 2020-12-16 DIAGNOSIS — N939 Abnormal uterine and vaginal bleeding, unspecified: Secondary | ICD-10-CM

## 2020-12-16 DIAGNOSIS — O418X10 Other specified disorders of amniotic fluid and membranes, first trimester, not applicable or unspecified: Secondary | ICD-10-CM

## 2020-12-16 DIAGNOSIS — O468X1 Other antepartum hemorrhage, first trimester: Secondary | ICD-10-CM

## 2020-12-16 DIAGNOSIS — O360110 Maternal care for anti-D [Rh] antibodies, first trimester, not applicable or unspecified: Secondary | ICD-10-CM | POA: Diagnosis not present

## 2020-12-16 DIAGNOSIS — Z3A08 8 weeks gestation of pregnancy: Secondary | ICD-10-CM | POA: Diagnosis not present

## 2020-12-16 DIAGNOSIS — O4691 Antepartum hemorrhage, unspecified, first trimester: Secondary | ICD-10-CM | POA: Insufficient documentation

## 2020-12-16 DIAGNOSIS — Z2913 Encounter for prophylactic Rho(D) immune globulin: Secondary | ICD-10-CM

## 2020-12-16 DIAGNOSIS — F17210 Nicotine dependence, cigarettes, uncomplicated: Secondary | ICD-10-CM | POA: Insufficient documentation

## 2020-12-16 LAB — CBC WITH DIFFERENTIAL/PLATELET
Abs Immature Granulocytes: 0.05 10*3/uL (ref 0.00–0.07)
Basophils Absolute: 0 10*3/uL (ref 0.0–0.1)
Basophils Relative: 0 %
Eosinophils Absolute: 0.1 10*3/uL (ref 0.0–0.5)
Eosinophils Relative: 1 %
HCT: 36.3 % (ref 36.0–46.0)
Hemoglobin: 11.9 g/dL — ABNORMAL LOW (ref 12.0–15.0)
Immature Granulocytes: 1 %
Lymphocytes Relative: 17 %
Lymphs Abs: 1.7 10*3/uL (ref 0.7–4.0)
MCH: 27 pg (ref 26.0–34.0)
MCHC: 32.8 g/dL (ref 30.0–36.0)
MCV: 82.5 fL (ref 80.0–100.0)
Monocytes Absolute: 0.6 10*3/uL (ref 0.1–1.0)
Monocytes Relative: 6 %
Neutro Abs: 7.7 10*3/uL (ref 1.7–7.7)
Neutrophils Relative %: 75 %
Platelets: 228 10*3/uL (ref 150–400)
RBC: 4.4 MIL/uL (ref 3.87–5.11)
RDW: 14.6 % (ref 11.5–15.5)
WBC: 10.1 10*3/uL (ref 4.0–10.5)
nRBC: 0 % (ref 0.0–0.2)

## 2020-12-16 LAB — URINALYSIS, COMPLETE (UACMP) WITH MICROSCOPIC
Bacteria, UA: NONE SEEN
Bilirubin Urine: NEGATIVE
Glucose, UA: NEGATIVE mg/dL
Ketones, ur: NEGATIVE mg/dL
Leukocytes,Ua: NEGATIVE
Nitrite: NEGATIVE
Protein, ur: NEGATIVE mg/dL
Specific Gravity, Urine: 1.002 — ABNORMAL LOW (ref 1.005–1.030)
pH: 7 (ref 5.0–8.0)

## 2020-12-16 LAB — BASIC METABOLIC PANEL
Anion gap: 10 (ref 5–15)
BUN: 7 mg/dL (ref 6–20)
CO2: 23 mmol/L (ref 22–32)
Calcium: 8.7 mg/dL — ABNORMAL LOW (ref 8.9–10.3)
Chloride: 103 mmol/L (ref 98–111)
Creatinine, Ser: 0.6 mg/dL (ref 0.44–1.00)
GFR, Estimated: >60 mL/min (ref >60)
Glucose, Bld: 80 mg/dL (ref 70–99)
Potassium: 3.6 mmol/L (ref 3.5–5.1)
Sodium: 136 mmol/L (ref 135–145)

## 2020-12-16 LAB — HCG, QUANTITATIVE, PREGNANCY: hCG, Beta Chain, Quant, S: 120032 m[IU]/mL — ABNORMAL HIGH (ref <5)

## 2020-12-16 LAB — ABO/RH: ABO/RH(D): A NEG

## 2020-12-16 LAB — ANTIBODY SCREEN: Antibody Screen: NEGATIVE

## 2020-12-16 LAB — POC URINE PREG, ED: Preg Test, Ur: POSITIVE — AB

## 2020-12-16 MED ORDER — RHO D IMMUNE GLOBULIN 1500 UNIT/2ML IJ SOSY
300.0000 ug | PREFILLED_SYRINGE | Freq: Once | INTRAMUSCULAR | Status: AC
  Administered 2020-12-16: 300 ug via INTRAMUSCULAR
  Filled 2020-12-16: qty 2

## 2020-12-16 NOTE — Discharge Instructions (Signed)
Take prenatal vitamins daily.  Call to schedule a follow up appointment with the OB/GYN

## 2020-12-16 NOTE — ED Notes (Signed)
Lab contacted regarding medication. Advised they would call once medication was prepared.

## 2020-12-16 NOTE — ED Provider Notes (Signed)
[facility] Emergency Department Provider Note  ____________________________________________  Time seen: Approximately 5:24 PM  I have reviewed the triage vital signs and the nursing notes.   HISTORY  Chief Complaint Vaginal Bleeding    HPI Kirsten Hurley is a 30 y.o. female who presents to the emergency department for treatment and evaluation of vaginal bleeding. She has had 3 positive pregnancy tests. LMP was October 18, 2020. G2P0A1. Bleeding episodes happen after intercourse. Last episode was 3-4 days ago. She had some mild abdominal cramping as well. No pain or bleeding today. She does not currently have a GYN.   History reviewed. No pertinent past medical history.  There are no problems to display for this patient.   History reviewed. No pertinent surgical history.  Prior to Admission medications   Medication Sig Start Date End Date Taking? Authorizing Provider  ibuprofen (ADVIL) 600 MG tablet Take 1 tablet (600 mg total) by mouth every 8 (eight) hours as needed for moderate pain or cramping. 09/28/20   Shaune Pollack, MD  medroxyPROGESTERone (PROVERA) 5 MG tablet Take 1 tablet (5 mg total) by mouth daily for 10 days. 09/28/20 10/08/20  Shaune Pollack, MD    Allergies Patient has no known allergies.  History reviewed. No pertinent family history.  Social History Social History   Tobacco Use  . Smoking status: Current Every Day Smoker    Types: Cigarettes  . Smokeless tobacco: Never Used  Substance Use Topics  . Alcohol use: Yes  . Drug use: Not Currently    Review of Systems Constitutional: Negative for fever. Respiratory: Negative for shortness of breath or cough. Gastrointestinal: Negative for abdominal pain; positive for nausea , negative for vomiting. Genitourinary: No for dysuria , negative for vaginal discharge. Musculoskeletal: Negative for back pain. Skin: Negative for acute skin  changes/rash/lesion. ____________________________________________   PHYSICAL EXAM:  VITAL SIGNS: ED Triage Vitals  Enc Vitals Group     BP 12/16/20 1553 (!) 146/79     Pulse Rate 12/16/20 1553 88     Resp 12/16/20 1553 18     Temp 12/16/20 1553 98.4 F (36.9 C)     Temp Source 12/16/20 1553 Oral     SpO2 12/16/20 1553 100 %     Weight 12/16/20 1551 230 lb (104.3 kg)     Height 12/16/20 1551 5\' 8"  (1.727 m)     Head Circumference --      Peak Flow --      Pain Score 12/16/20 1551 0     Pain Loc --      Pain Edu? --      Excl. in GC? --     Constitutional: Alert and oriented. Well appearing and in no acute distress. Eyes: Conjunctivae are normal. Head: Atraumatic. Nose: No congestion/rhinnorhea. Mouth/Throat: Mucous membranes are moist. Respiratory: Normal respiratory effort.  No retractions. Gastrointestinal: Bowel sounds active x 4; Abdomen is soft without rebound or guarding. Genitourinary: Pelvic exam: exam deferred Musculoskeletal: No extremity tenderness nor edema.  Neurologic:  Normal speech and language. No gross focal neurologic deficits are appreciated. Speech is normal. No gait instability. Skin:  Skin is warm, dry and intact. No rash noted on exposed skin. Psychiatric: Mood and affect are normal. Speech and behavior are normal.  ____________________________________________   LABS (all labs ordered are listed, but only abnormal results are displayed)  Labs Reviewed  BASIC METABOLIC PANEL - Abnormal; Notable for the following components:      Result Value   Calcium 8.7 (*)  All other components within normal limits  CBC WITH DIFFERENTIAL/PLATELET - Abnormal; Notable for the following components:   Hemoglobin 11.9 (*)    All other components within normal limits  URINALYSIS, COMPLETE (UACMP) WITH MICROSCOPIC - Abnormal; Notable for the following components:   Color, Urine STRAW (*)    APPearance HAZY (*)    Specific Gravity, Urine 1.002 (*)    Hgb  urine dipstick MODERATE (*)    All other components within normal limits  HCG, QUANTITATIVE, PREGNANCY - Abnormal; Notable for the following components:   hCG, Beta Chain, Quant, S 120,032 (*)    All other components within normal limits  POC URINE PREG, ED - Abnormal; Notable for the following components:   Preg Test, Ur POSITIVE (*)    All other components within normal limits  ABO/RH  ANTIBODY SCREEN  RHOGAM INJECTION   ____________________________________________  RADIOLOGY  Ultrasound shows single IUP 8 weeks 1 day with fetal heart rate of 189.  Small subchorionic hemorrhage is noted. ____________________________________________  Procedures  ____________________________________________  30 year old female presenting to the ER for evaluation of vaginal bleeding. See HPI. Plan will be to get labs and order ultrasound. Although she is not currently bleeding, I will get ABO/RH and give Rhogam if indicated.  Patient's blood type is A-.  RhoGAM ordered.  Results of the ultrasound were discussed with the patient who feels reassured.  She is to call and schedule an appointment with gynecology.  She was advised to start taking prenatal vitamins.  She is to return to the emergency department if she develops any significant abdominal cramping or heavy vaginal bleeding if she is unable to see the gynecologist right away.  INITIAL IMPRESSION / ASSESSMENT AND PLAN / ED COURSE  Pertinent labs & imaging results that were available during my care of the patient were reviewed by me and considered in my medical decision making (see chart for details).  ____________________________________________   FINAL CLINICAL IMPRESSION(S) / ED DIAGNOSES  Final diagnoses:  Need for rhogam due to Rh negative mother  Vaginal bleeding in pregnancy  Subchorionic hematoma in first trimester, single or unspecified fetus    Note:  This document was prepared using Dragon voice recognition software and may  include unintentional dictation errors.   Chinita Pester, FNP 12/16/20 2315    Gilles Chiquito, MD 12/17/20 1019

## 2020-12-16 NOTE — ED Triage Notes (Signed)
Pt comes pov with positive pregnancy tests then x2 episodes of vaginal bleeding. Nov 15th LMP. Not bleeding at this time.

## 2020-12-16 NOTE — ED Notes (Signed)
Pt states several weeks ago she had a positive pregnancy test. Pt concerned for vaginal bleeding, bright red in color, as per pt. Pt denies pain

## 2020-12-17 LAB — RHOGAM INJECTION: Unit division: 0

## 2021-01-17 ENCOUNTER — Ambulatory Visit: Payer: Medicaid Other | Admitting: Family Medicine

## 2021-01-17 ENCOUNTER — Other Ambulatory Visit: Payer: Self-pay

## 2021-01-17 ENCOUNTER — Encounter: Payer: Self-pay | Admitting: Advanced Practice Midwife

## 2021-01-17 VITALS — BP 129/87 | HR 83 | Temp 98.2°F | Wt 253.6 lb

## 2021-01-17 DIAGNOSIS — Z6838 Body mass index (BMI) 38.0-38.9, adult: Secondary | ICD-10-CM | POA: Insufficient documentation

## 2021-01-17 DIAGNOSIS — Z3480 Encounter for supervision of other normal pregnancy, unspecified trimester: Secondary | ICD-10-CM

## 2021-01-17 DIAGNOSIS — E669 Obesity, unspecified: Secondary | ICD-10-CM | POA: Insufficient documentation

## 2021-01-17 DIAGNOSIS — O0990 Supervision of high risk pregnancy, unspecified, unspecified trimester: Secondary | ICD-10-CM

## 2021-01-17 DIAGNOSIS — Z23 Encounter for immunization: Secondary | ICD-10-CM

## 2021-01-17 HISTORY — DX: Supervision of high risk pregnancy, unspecified, unspecified trimester: O09.90

## 2021-01-17 LAB — URINALYSIS
Bilirubin, UA: NEGATIVE
Glucose, UA: NEGATIVE
Ketones, UA: NEGATIVE
Nitrite, UA: POSITIVE — AB
RBC, UA: NEGATIVE
Specific Gravity, UA: 1.02 (ref 1.005–1.030)
Urobilinogen, Ur: 0.2 mg/dL (ref 0.2–1.0)
pH, UA: 8.5 — ABNORMAL HIGH (ref 5.0–7.5)

## 2021-01-17 LAB — HEMOGLOBIN, FINGERSTICK: Hemoglobin: 11.8 g/dL (ref 11.1–15.9)

## 2021-01-17 LAB — WET PREP FOR TRICH, YEAST, CLUE
Trichomonas Exam: NEGATIVE
Yeast Exam: NEGATIVE

## 2021-01-17 NOTE — Progress Notes (Signed)
[facility] HEALTH DEPT [facility] [address] [place] [address] [id number]  INITIAL PRENATAL VISIT NOTE  Subjective:  Kirsten Hurley is a 30 y.o. G2P0010 at [redacted]w[redacted]d being seen today to start prenatal care at the [facility] Department.  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  Patient reports no complaints.  Contractions: Not present. Vag. Bleeding: None.  Movement: Absent. Denies leaking of fluid.   Indications for ASA therapy (per uptodate) One of the following: Previous pregnancy with preeclampsia, especially early onset and with an adverse outcome No Multifetal gestation No Chronic hypertension No Type 1 or 2 diabetes mellitus No Chronic kidney disease No Autoimmune disease (antiphospholipid syndrome, systemic lupus erythematosus) No  Two or more of the following: Nulliparity Yes Obesity (body mass index >30 kg/m2) Yes Family history of preeclampsia in mother or sister No Age ?35 years No Sociodemographic characteristics (African American race, low socioeconomic level) No Personal risk factors (eg, previous pregnancy with low birth weight or small for gestational age infant, previous adverse pregnancy outcome [eg, stillbirth], interval >10 years between pregnancies) Yes   The following portions of the patient's history were reviewed and updated as appropriate: allergies, current medications, past family history, past medical history, past social history, past surgical history and problem list. Problem list updated.  Objective:   Vitals:   01/17/21 1351  BP: 129/87  Pulse: 83  Temp: 98.2 F (36.8 C)  Weight: 253 lb 9.6 oz (115 kg)    Fetal Status:   Fundal Height: 13 cm Movement: Absent  Presentation:  Undeterminable   Physical Exam Vitals and nursing note reviewed.  Constitutional:      General: She is not in acute distress.    Appearance: Normal appearance. She is well-developed.  HENT:     Head: Normocephalic and atraumatic.     Right Ear: External ear normal.     Left Ear: External ear normal.     Nose: Nose normal. No congestion or rhinorrhea.     Mouth/Throat:     Lips: Pink.     Mouth: Mucous membranes are moist.     Dentition: Normal dentition. No dental caries.     Pharynx: Oropharynx is clear. Uvula midline.  Eyes:     General: No scleral icterus.    Conjunctiva/sclera: Conjunctivae normal.  Neck:     Thyroid: No thyroid mass or thyromegaly.  Cardiovascular:     Rate and Rhythm: Normal rate.     Pulses: Normal pulses.     Comments: Extremities are warm and well perfused Pulmonary:     Effort: Pulmonary effort is normal.     Breath sounds: Normal breath sounds.  Chest:     Chest wall: No mass.  Breasts:     Tanner Score is 5. Breasts are symmetrical.     Right: Normal. No mass, nipple discharge, skin change or axillary adenopathy.     Left: Normal. No mass, nipple discharge, skin change or axillary adenopathy.    Abdominal:     General: Abdomen is flat.     Palpations: Abdomen is soft.     Tenderness: There is no abdominal tenderness.     Comments: Gravid   Genitourinary:    General: Normal vulva.     Exam position: Lithotomy position.  Pubic Area: No rash.      Labia:        Right: No rash.        Left: No rash.      Vagina: Normal. No vaginal discharge.     Cervix: No cervical motion tenderness or friability.     Uterus: Normal. Not tender. Enlarged: Gravid 12 size       Adnexa: Right adnexa normal and left adnexa normal.     Rectum: Normal. No external hemorrhoid.  Musculoskeletal:     Right lower leg: No edema.     Left lower leg: No edema.  Lymphadenopathy:     Cervical: No cervical adenopathy.     Upper Body:     Right upper body: No  axillary adenopathy.     Left upper body: No axillary adenopathy.  Skin:    General: Skin is warm.     Capillary Refill: Capillary refill takes less than 2 seconds.  Neurological:     Mental Status: She is alert.     Assessment and Plan:  Pregnancy: G2P0010 at [redacted]w[redacted]d  1. Class 2 obesity with body mass index (BMI) of 38.0 to 38.9 in adult, unspecified obesity type, unspecified whether serious comorbidity present  - Amb ref to Medical Nutrition Therapy-MNT  2. Supervision of other normal pregnancy, antepartum  - TSH - Urine Culture - Chlamydia/GC NAA, Confirmation - HIV-1/HIV-2 Qualitative RNA - HCV Ab w Reflex to Quant PCR - Hgb A1c w/o eAG - Prenatal Profile with Varicella(282020) - Glucose, 1 hour gestational - Protein / creatinine ratio, urine  (Spot) - Comprehensive metabolic panel - WET PREP FOR TRICH, YEAST, CLUE - Hemoglobin, venipuncture - Pap IG (Image Guided) - Urinalysis  Patient in clinic today for initial OB and is nervous but excited about this pregnancy.  She lives in [place] with mother, brother and boyfriend.  She is not currently working.  FOB is supportive and so is family.    She denies nausea, bleeding or contractions.   Patient was seen at [facility] ER on 12/16/20 for abnormal vaginal bleeding and had US done.   Per [place] patient was 12w 5d .  Patent LMP was 11/15/202   Patient denies any chronic medical conditions, history of STI, abuse, allergies.  Patient is only taking PNV and tylenol as needed for HA.   Patient has not PAP history.  Pap collected today.   PHQ- 9 was 6 declines need for counseling.   Patient reports last ETOH was 02/2020 and last tobacco used was 12/16/2020.  Denies second hand smoke, that family smokes out side of home.  Patient declined UDS today.            Last dentist visit was > 15 yrs ago, dental list given.    Patient was educated on  PAP, preeclampsia,  ADA therapy and obesity during pregnancy,  MNT referral done.  Patient wants  first screen, denies any family hx of genetic abnormalities referral form completed.     Discussed overview of care and coordination with inpatient delivery practices including [facility], [facility], [facility] and [facility].    Preterm labor symptoms and general obstetric precautions including but not limited to vaginal bleeding, contractions, leaking of fluid and fetal movement were reviewed in detail with the patient.  Please refer to After Visit Summary for other counseling recommendations.   Return in about 4 weeks (around 02/14/2021) for routine prenatal care.  Future Appointments  Date Time Provider Department Center  02/14/2021  3:00 PM [facility] PROVIDER [facility] None    Wendi Snipes, FNP

## 2021-01-17 NOTE — Progress Notes (Unsigned)
Presents for initiation of PNC and taking PNV QD. [facility] ED evaluation 12/17/2019 for vaginal and [place] indicated viable IUP at * 1/7. Received Rhophylac at ED visit. Accepts flu vaccine but declines Covid vaccine. Jossie Ng, RN  Wet mount and hgb reviewed - no intervention per standing order. Urine dip with trace protein, nitrite positive and trace leukocytes. Urine culture results pending. Jossie Ng, RN

## 2021-01-18 LAB — PROTEIN / CREATININE RATIO, URINE
Creatinine, Urine: 207.1 mg/dL
Protein, Ur: 41.4 mg/dL
Protein/Creat Ratio: 200 mg/g creat (ref 0–200)

## 2021-01-19 ENCOUNTER — Telehealth: Payer: Self-pay

## 2021-01-19 LAB — HIV-1/HIV-2 QUALITATIVE RNA
HIV-1 RNA, Qualitative: NONREACTIVE
HIV-2 RNA, Qualitative: NONREACTIVE

## 2021-01-19 LAB — URINE CULTURE

## 2021-01-19 LAB — CHLAMYDIA/GC NAA, CONFIRMATION
Chlamydia trachomatis, NAA: NEGATIVE
Neisseria gonorrhoeae, NAA: NEGATIVE

## 2021-01-19 NOTE — Telephone Encounter (Signed)
Per Clydie Braun MFM scheduler, no available appts for first trimester screen. Call to client with above information and offered to schedule first trimester screen at [facility]. Client declined stating she will do the 15 week birth defect screening. Jossie Ng, RN

## 2021-01-20 ENCOUNTER — Telehealth: Payer: Self-pay | Admitting: Dietician

## 2021-01-20 ENCOUNTER — Telehealth: Payer: Self-pay

## 2021-01-20 LAB — PAP IG (IMAGE GUIDED): PAP Smear Comment: 0

## 2021-01-20 NOTE — Telephone Encounter (Signed)
Call to emergency contact (mother) to determine if client at that number so could schedule UTI appt. Call not answered and no voicemail set up (note on pink sticky to request contact set up voicemail). Jossie Ng, RN

## 2021-01-20 NOTE — Telephone Encounter (Signed)
TC to patient to inform patient of UTI and needs treatment. LM on patient home phone with number to call, and tried cell number. Per cell service "subscriber unable to receive messages at this time".Burt Knack, RN

## 2021-01-21 ENCOUNTER — Telehealth: Payer: Self-pay

## 2021-01-21 DIAGNOSIS — Z6791 Unspecified blood type, Rh negative: Secondary | ICD-10-CM | POA: Insufficient documentation

## 2021-01-21 DIAGNOSIS — R7309 Other abnormal glucose: Secondary | ICD-10-CM | POA: Insufficient documentation

## 2021-01-21 DIAGNOSIS — O26899 Other specified pregnancy related conditions, unspecified trimester: Secondary | ICD-10-CM | POA: Insufficient documentation

## 2021-01-21 HISTORY — DX: Other abnormal glucose: R73.09

## 2021-01-21 LAB — COMPREHENSIVE METABOLIC PANEL
ALT: 9 IU/L (ref 0–32)
AST: 10 IU/L (ref 0–40)
Albumin/Globulin Ratio: 1.4 (ref 1.2–2.2)
Albumin: 4 g/dL (ref 3.9–5.0)
Alkaline Phosphatase: 57 IU/L (ref 44–121)
BUN/Creatinine Ratio: 9 (ref 9–23)
BUN: 5 mg/dL — ABNORMAL LOW (ref 6–20)
Bilirubin Total: <0.2 mg/dL (ref 0.0–1.2)
CO2: 18 mmol/L — ABNORMAL LOW (ref 20–29)
Calcium: 9.2 mg/dL (ref 8.7–10.2)
Chloride: 99 mmol/L (ref 96–106)
Creatinine, Ser: 0.53 mg/dL — ABNORMAL LOW (ref 0.57–1.00)
GFR calc Af Amer: 148 mL/min/{1.73_m2} (ref >59)
GFR calc non Af Amer: 129 mL/min/{1.73_m2} (ref >59)
Globulin, Total: 2.8 g/dL (ref 1.5–4.5)
Glucose: 137 mg/dL — ABNORMAL HIGH (ref 65–99)
Potassium: 3.8 mmol/L (ref 3.5–5.2)
Sodium: 132 mmol/L — ABNORMAL LOW (ref 134–144)
Total Protein: 6.8 g/dL (ref 6.0–8.5)

## 2021-01-21 LAB — CBC/D/PLT+RPR+RH+ABO+RUB AB...
Basophils Absolute: 0 10*3/uL (ref 0.0–0.2)
Basos: 0 %
EOS (ABSOLUTE): 0.1 10*3/uL (ref 0.0–0.4)
Eos: 1 %
Hematocrit: 33.9 % — ABNORMAL LOW (ref 34.0–46.6)
Hemoglobin: 11.3 g/dL (ref 11.1–15.9)
Hepatitis B Surface Ag: NEGATIVE
Immature Grans (Abs): 0.1 10*3/uL (ref 0.0–0.1)
Immature Granulocytes: 1 %
Lymphocytes Absolute: 1.5 10*3/uL (ref 0.7–3.1)
Lymphs: 15 %
MCH: 27.9 pg (ref 26.6–33.0)
MCHC: 33.3 g/dL (ref 31.5–35.7)
MCV: 84 fL (ref 79–97)
Monocytes Absolute: 0.4 10*3/uL (ref 0.1–0.9)
Monocytes: 4 %
Neutrophils Absolute: 8.2 10*3/uL — ABNORMAL HIGH (ref 1.4–7.0)
Neutrophils: 79 %
Platelets: 201 10*3/uL (ref 150–450)
RBC: 4.05 x10E6/uL (ref 3.77–5.28)
RDW: 14.9 % (ref 11.7–15.4)
RPR Ser Ql: NONREACTIVE
Rh Factor: NEGATIVE
Rubella Antibodies, IGG: 6.23 index (ref >0.99)
Varicella zoster IgG: 1989 index (ref >165)
WBC: 10.3 10*3/uL (ref 3.4–10.8)

## 2021-01-21 LAB — GLUCOSE, 1 HOUR GESTATIONAL: Gestational Diabetes Screen: 135 mg/dL (ref 65–139)

## 2021-01-21 LAB — HGB A1C W/O EAG: Hgb A1c MFr Bld: 4.8 % (ref 4.8–5.6)

## 2021-01-21 LAB — HCV INTERPRETATION

## 2021-01-21 LAB — AB SCR+ANTIBODY ID: Antibody Screen: POSITIVE — AB

## 2021-01-21 LAB — TSH: TSH: 1.51 u[IU]/mL (ref 0.450–4.500)

## 2021-01-21 LAB — HCV AB W REFLEX TO QUANT PCR: HCV Ab: <0.1 s/co ratio (ref 0.0–0.9)

## 2021-01-21 NOTE — Telephone Encounter (Signed)
Call to client and counseled regarding has UTI and needs treatment. Appt today encouraged, but client states can't come today. Appt scheduled for 01/24/2021 (Monday). Jossie Ng, RN

## 2021-01-21 NOTE — Telephone Encounter (Signed)
One hour glucola test result = 135 and needs 3 hour GTT. Call to client and 01/24/21 appt rescheduled for 0820 with arrival tine of 0800. Client counseled regarding need for 3 hour GTT and test prep instructions verbally provided with stated understanding. Client aware during glucose testing she will see provider for UTI treatment. Jossie Ng, RN

## 2021-01-24 ENCOUNTER — Ambulatory Visit: Payer: Medicaid Other | Admitting: Family Medicine

## 2021-01-24 ENCOUNTER — Other Ambulatory Visit: Payer: Self-pay

## 2021-01-24 ENCOUNTER — Telehealth: Payer: Self-pay

## 2021-01-24 VITALS — BP 127/79 | HR 98 | Temp 98.5°F | Wt 252.4 lb

## 2021-01-24 DIAGNOSIS — O2340 Unspecified infection of urinary tract in pregnancy, unspecified trimester: Secondary | ICD-10-CM

## 2021-01-24 DIAGNOSIS — Z3480 Encounter for supervision of other normal pregnancy, unspecified trimester: Secondary | ICD-10-CM

## 2021-01-24 MED ORDER — NITROFURANTOIN MONOHYD MACRO 100 MG PO CAPS
100.0000 mg | ORAL_CAPSULE | Freq: Two times a day (BID) | ORAL | 0 refills | Status: AC
Start: 2021-01-24 — End: 2021-01-31

## 2021-01-24 NOTE — Telephone Encounter (Signed)
Client with 0800 appt today for 3 hour GTT and UTI treatment. Client inadvertently rescheduled by clerical this am for 01/31/2021. As needs UTI treatment, call to client and per FOB mother, client is not there, but at her mother's. She agreed to assist in contacting client to have her call [facility]. Jossie Ng, RN

## 2021-01-24 NOTE — Progress Notes (Addendum)
Presents today for UTI treatment. Client aware of 01/28/2021 3 hour GTT appt and states can have nothing to eat or drink accept sips of plain water after 8 pm on Thursday (01/27/21). Remains undecided as to [facility] and Pediatrician - has printed info. Counseled Rh negative - has printed info received at [facility] IP appt. [facility] record given. Has reminder card for next [facility] RV appt 02/14/2021. Jossie Ng, RN  [facility] anatomy US referral faxed with snapshot pages and [facility] ED [place] report. Fax confirmation received. Anatimy [place] needed in 4 - 5 weeks. Verified with Artelia Laroche, Gerda Diss, that client has Medicaid. Jossie Ng, RN

## 2021-01-24 NOTE — Patient Instructions (Signed)
Urinary Tract Infection, Adult A urinary tract infection (UTI) is an infection of any part of the urinary tract. The urinary tract includes:  The kidneys.  The ureters.  The bladder.  The urethra. These organs make, store, and get rid of pee (urine) in the body. What are the causes? This infection is caused by germs (bacteria) in your genital area. These germs grow and cause swelling (inflammation) of your urinary tract. What increases the risk? The following factors may make you more likely to develop this condition:  Using a small, thin tube (catheter) to drain pee.  Not being able to control when you pee or poop (incontinence).  Being female. If you are female, these things can increase the risk: ? Using these methods to prevent pregnancy:  A medicine that kills sperm (spermicide).  A device that blocks sperm (diaphragm). ? Having low levels of a female hormone (estrogen). ? Being pregnant. You are more likely to develop this condition if:  You have genes that add to your risk.  You are sexually active.  You take antibiotic medicines.  You have trouble peeing because of: ? A prostate that is bigger than normal, if you are female. ? A blockage in the part of your body that drains pee from the bladder. ? A kidney stone. ? A nerve condition that affects your bladder. ? Not getting enough to drink. ? Not peeing often enough.  You have other conditions, such as: ? Diabetes. ? A weak disease-fighting system (immune system). ? Sickle cell disease. ? Gout. ? Injury of the spine. What are the signs or symptoms? Symptoms of this condition include:  Needing to pee right away.  Peeing small amounts often.  Pain or burning when peeing.  Blood in the pee.  Pee that smells bad or not like normal.  Trouble peeing.  Pee that is cloudy.  Fluid coming from the vagina, if you are female.  Pain in the belly or lower back. Other symptoms include:  Vomiting.  Not  feeling hungry.  Feeling mixed up (confused). This may be the first symptom in older adults.  Being tired and grouchy (irritable).  A fever.  Watery poop (diarrhea). How is this treated?  Taking antibiotic medicine.  Taking other medicines.  Drinking enough water. In some cases, you may need to see a specialist. Follow these instructions at home: Medicines  Take over-the-counter and prescription medicines only as told by your doctor.  If you were prescribed an antibiotic medicine, take it as told by your doctor. Do not stop taking it even if you start to feel better. General instructions  Make sure you: ? Pee until your bladder is empty. ? Do not hold pee for a long time. ? Empty your bladder after sex. ? Wipe from front to back after peeing or pooping if you are a female. Use each tissue one time when you wipe.  Drink enough fluid to keep your pee pale yellow.  Keep all follow-up visits.   Contact a doctor if:  You do not get better after 1-2 days.  Your symptoms go away and then come back. Get help right away if:  You have very bad back pain.  You have very bad pain in your lower belly.  You have a fever.  You have chills.  You feeling like you will vomit or you vomit. Summary  A urinary tract infection (UTI) is an infection of any part of the urinary tract.  This condition is caused by   germs in your genital area.  There are many risk factors for a UTI.  Treatment includes antibiotic medicines.  Drink enough fluid to keep your pee pale yellow. This information is not intended to replace advice given to you by your health care provider. Make sure you discuss any questions you have with your health care provider. Document Revised: 07/02/2020 Document Reviewed: 07/02/2020 Elsevier Patient Education  2021 Elsevier Inc.  

## 2021-01-24 NOTE — Telephone Encounter (Signed)
Patient returned our phone call and is rescheduled to come in this afternoon for UTI treatment, and on 01/28/2021 for 3 hour gtt. Reviewed fasting instructions for gtt and patient states understanding.Burt Knack, RN

## 2021-01-24 NOTE — Progress Notes (Addendum)
   PRENATAL VISIT NOTE  Subjective:  Kirsten Hurley is a 30 y.o. G2P0010 at [redacted]w[redacted]d being seen today for ongoing prenatal care.  She is currently monitored for the following issues for this low-risk pregnancy and has Supervision of other normal pregnancy, antepartum; Class 2 obesity with body mass index (BMI) of 38.0 to 38.9 in adult; Rh negative state in antepartum period; and Abnormal glucose tolerance test on their problem list.  Patient reports no complaints.  Contractions: Not present. Vag. Bleeding: None.  Movement: Absent. Denies leaking of fluid/ROM.   The following portions of the patient's history were reviewed and updated as appropriate: allergies, current medications, past family history, past medical history, past social history, past surgical history and problem list. Problem list updated.  Objective:   Vitals:   01/24/21 1309  BP: 127/79  Pulse: 98  Temp: 98.5 F (36.9 C)  Weight: 252 lb 6.4 oz (114.5 kg)    Fetal Status: Fetal Heart Rate (bpm): 148 Fundal Height: 14 cm Movement: Absent     General:  Alert, oriented and cooperative. Patient is in no acute distress.  Skin: Skin is warm and dry. No rash noted.   Cardiovascular: Normal heart rate noted  Respiratory: Normal respiratory effort, no problems with respiration noted  Abdomen: Soft, gravid, appropriate for gestational age.  Pain/Pressure: Absent     Pelvic: Cervical exam deferred        Extremities: Normal range of motion.  Edema: None  Mental Status: Normal mood and affect. Normal behavior. Normal judgment and thought content.   Assessment and Plan:  Pregnancy: G2P0010 at [redacted]w[redacted]d  1. Supervision of other normal pregnancy, antepartum PNV-Patient taking daily Plans to breast feed- discussed lactation consultant if needed  Pediatrician-  Undecided  PP contraception - undecided  Discussed Weight gain of 11-20 lbs as appropriate based on BMI  -Nutrition/exercise: discussed with patient exercising to help with  body changes with pregnancy.   Discussed Expected pains of back and broad ligament   Paper work for anatomy U/S completed and given to nursing for scheduling.    2. Urinary tract infection in mother during pregnancy, antepartum  discussed with patient the need for UTI treatment.  Medication instructions given.   - nitrofurantoin, macrocrystal-monohydrate, (MACROBID) 100 MG capsule; Take 1 capsule (100 mg total) by mouth 2 (two) times daily for 7 days.  Dispense: 14 capsule; Refill: 0    Preterm labor symptoms and general obstetric precautions including but not limited to vaginal bleeding, contractions, leaking of fluid and fetal movement were reviewed in detail with the patient. Please refer to After Visit Summary for other counseling recommendations.  No follow-ups on file.  Future Appointments  Date Time Provider Department Center  01/28/2021  8:20 AM [facility] NURSE [facility] None  02/14/2021  3:00 PM [facility] PROVIDER [facility] None    Wendi Snipes, FNP

## 2021-01-25 ENCOUNTER — Telehealth: Payer: Self-pay

## 2021-01-25 NOTE — Telephone Encounter (Signed)
Call to [facility] to ascertain if anatomy US has been scheduled (referral faxed 01/24/2021). [place] appt is 03/03/2021 at 1000. Call to client with appt and counseled to arrive between 9:30 - 9:40 am. Jossie Ng, RN

## 2021-01-28 ENCOUNTER — Other Ambulatory Visit: Payer: Medicaid Other

## 2021-01-28 ENCOUNTER — Other Ambulatory Visit: Payer: Self-pay

## 2021-01-28 DIAGNOSIS — Z3480 Encounter for supervision of other normal pregnancy, unspecified trimester: Secondary | ICD-10-CM

## 2021-01-28 NOTE — Progress Notes (Signed)
Patient in nurse clinic today for 3 hour GTT. Instructions given, patient sent to lab. No history of travel noted. NPO since last night at 8:30pm Richmond Campbell, RN

## 2021-01-29 LAB — GLUCOSE TOLERANCE TEST, 6 HOUR
Glucose, 1 Hour GTT: 166 mg/dL (ref 65–199)
Glucose, 2 hour: 119 mg/dL (ref 65–139)
Glucose, 3 hour: 52 mg/dL — ABNORMAL LOW (ref 65–109)
Glucose, GTT - Fasting: 76 mg/dL (ref 65–99)

## 2021-01-29 NOTE — Progress Notes (Signed)
Chart reviewed by Pharmacist  Suzanne Walker PharmD, Contract Pharmacist at [facility]  

## 2021-02-11 ENCOUNTER — Encounter: Payer: Self-pay | Admitting: Family Medicine

## 2021-02-11 DIAGNOSIS — O99210 Obesity complicating pregnancy, unspecified trimester: Secondary | ICD-10-CM | POA: Insufficient documentation

## 2021-02-14 ENCOUNTER — Ambulatory Visit: Payer: Self-pay

## 2021-02-14 ENCOUNTER — Ambulatory Visit: Payer: Medicaid Other | Admitting: Family Medicine

## 2021-02-14 ENCOUNTER — Other Ambulatory Visit: Payer: Self-pay

## 2021-02-14 DIAGNOSIS — O99210 Obesity complicating pregnancy, unspecified trimester: Secondary | ICD-10-CM

## 2021-02-14 DIAGNOSIS — Z3480 Encounter for supervision of other normal pregnancy, unspecified trimester: Secondary | ICD-10-CM

## 2021-02-14 DIAGNOSIS — O26899 Other specified pregnancy related conditions, unspecified trimester: Secondary | ICD-10-CM

## 2021-02-14 DIAGNOSIS — Z6791 Unspecified blood type, Rh negative: Secondary | ICD-10-CM

## 2021-02-14 DIAGNOSIS — O2340 Unspecified infection of urinary tract in pregnancy, unspecified trimester: Secondary | ICD-10-CM

## 2021-02-14 LAB — URINALYSIS
Bilirubin, UA: NEGATIVE
Glucose, UA: NEGATIVE
Leukocytes,UA: NEGATIVE
Nitrite, UA: NEGATIVE
Protein,UA: NEGATIVE
RBC, UA: NEGATIVE
Specific Gravity, UA: 1.025 (ref 1.005–1.030)
Urobilinogen, Ur: 0.2 mg/dL (ref 0.2–1.0)
pH, UA: 6 (ref 5.0–7.5)

## 2021-02-14 NOTE — Progress Notes (Signed)
Urine dip reviewed with provider, no new orders.Burt Knack, RN

## 2021-02-14 NOTE — Progress Notes (Signed)
Patient here for MH RV at 17 weeks. Needs urine TOC today. Patient desires Quad screen.Burt Knack, RN

## 2021-02-14 NOTE — Progress Notes (Signed)
   PRENATAL VISIT NOTE  Subjective:  Kirsten Hurley is a 30 y.o. G2P0010 at [redacted]w[redacted]d being seen today for ongoing prenatal care.  She is currently monitored for the following issues for this high-risk pregnancy and has Supervision of high risk pregnancy, antepartum; Class 2 obesity with body mass index (BMI) of 38.0 to 38.9 in adult; Rh negative state in antepartum period; Abnormal glucose tolerance test; and Obesity during pregnancy, antepartum on their problem list.  Patient reports no complaints.  Contractions: Not present. Vag. Bleeding: None.  Movement: Present. Denies leaking of fluid/ROM.   The following portions of the patient's history were reviewed and updated as appropriate: allergies, current medications, past family history, past medical history, past social history, past surgical history and problem list. Problem list updated.  Objective:  There were no vitals filed for this visit.  Fetal Status: Fetal Heart Rate (bpm): 154 Fundal Height: 18 cm Movement: Present     General:  Alert, oriented and cooperative. Patient is in no acute distress.  Skin: Skin is warm and dry. No rash noted.   Cardiovascular: Normal heart rate noted  Respiratory: Normal respiratory effort, no problems with respiration noted  Abdomen: Soft, gravid, appropriate for gestational age.  Pain/Pressure: Absent     Pelvic: Cervical exam deferred        Extremities: Normal range of motion.  Edema: None  Mental Status: Normal mood and affect. Normal behavior. Normal judgment and thought content.   Assessment and Plan:  Pregnancy: G2P0010 at [redacted]w[redacted]d  1. Supervision of other normal pregnancy, antepartum 2. Urinary tract infection in mother during pregnancy, antepartum 3. Obesity during pregnancy, antepartum 4. Rh negative state in antepartum period - QUAD Screen [facility] Only - Urine Culture  TOC for UTI today  Labs drawn for QUAD screen.  Patient reports that constipation is relieved and has been having BM every  other day.   Discussed increasing water intake and exercise  Patient is taking ASA and PNV as directed.    Preterm labor symptoms and general obstetric precautions including but not limited to vaginal bleeding, contractions, leaking of fluid and fetal movement were reviewed in detail with the patient. Please refer to After Visit Summary for other counseling recommendations.  Return in about 4 weeks (around 03/14/2021) for routine prenatal care.  Future Appointments  Date Time Provider Department Center  03/14/2021  3:00 PM [facility] PROVIDER [facility] None    Wendi Snipes, FNP

## 2021-02-16 LAB — URINE CULTURE

## 2021-03-01 ENCOUNTER — Telehealth: Payer: Self-pay | Admitting: Family Medicine

## 2021-03-01 NOTE — Telephone Encounter (Signed)
Phone call to pt x2 at [phone]. Received message that pt's phone number will not accept calls from the calling party with blocked number. Unable to leave message.  Called alternate number x2 at [phone] and received message that number not in service. Unable to leave message.

## 2021-03-01 NOTE — Telephone Encounter (Signed)
Pt wants to know the results of the quad screening test she had done a couple of weeks ago.

## 2021-03-02 NOTE — Telephone Encounter (Signed)
Phone call to pt. Counseled pt that the provider will need to review all the specific details of the Quad report with her at the next [facility] revisit, but in general with most testing, [facility] typically does not call when we do not see concerns with test results, we typically try to get in touch with pt ASAP when we see results that are abnormal or concerning. Confirmed pt's appt for 03/14/21 with provider.

## 2021-03-03 ENCOUNTER — Encounter: Payer: Self-pay | Admitting: Advanced Practice Midwife

## 2021-03-14 ENCOUNTER — Ambulatory Visit: Payer: Medicaid Other | Admitting: Family Medicine

## 2021-03-14 ENCOUNTER — Other Ambulatory Visit: Payer: Self-pay

## 2021-03-14 VITALS — BP 122/77 | HR 103 | Temp 98.2°F | Wt 269.6 lb

## 2021-03-14 DIAGNOSIS — O0992 Supervision of high risk pregnancy, unspecified, second trimester: Secondary | ICD-10-CM

## 2021-03-14 DIAGNOSIS — O0990 Supervision of high risk pregnancy, unspecified, unspecified trimester: Secondary | ICD-10-CM

## 2021-03-14 MED ORDER — PREPLUS 27-1 MG PO TABS: 1.0000 | ORAL_TABLET | Freq: Every day | ORAL | 1 refills | Status: DC

## 2021-03-14 NOTE — Progress Notes (Signed)
   PRENATAL VISIT NOTE  Subjective:  Kirsten Hurley is a 30 y.o. G2P0010 at [redacted]w[redacted]d being seen today for ongoing prenatal care.  She is currently monitored for the following issues for this high-risk pregnancy and has Supervision of high risk pregnancy, antepartum; Class 2 obesity with body mass index (BMI) of 38.0 to 38.9 in adult; Rh negative state in antepartum period; Abnormal glucose tolerance test; and Obesity during pregnancy, antepartum on their problem list.  Patient reports no complaints.  Contractions: Not present. Vag. Bleeding: None.  Movement: Present. Denies leaking of fluid/ROM.   The following portions of the patient's history were reviewed and updated as appropriate: allergies, current medications, past family history, past medical history, past social history, past surgical history and problem list. Problem list updated.  Objective:   Vitals:   03/14/21 1459  BP: 122/77  Pulse: (!) 103  Temp: 98.2 F (36.8 C)  Weight: 269 lb 9.6 oz (122.3 kg)    Fetal Status: Fetal Heart Rate (bpm): 140 Fundal Height: 25 cm Movement: Present     General:  Alert, oriented and cooperative. Patient is in no acute distress.  Skin: Skin is warm and dry. No rash noted.   Cardiovascular: Normal heart rate noted  Respiratory: Normal respiratory effort, no problems with respiration noted  Abdomen: Soft, gravid, appropriate for gestational age.  Pain/Pressure: Absent     Pelvic: Cervical exam deferred        Extremities: Normal range of motion.  Edema: None  Mental Status: Normal mood and affect. Normal behavior. Normal judgment and thought content.   Assessment and Plan:  Pregnancy: G2P0010 at [redacted]w[redacted]d  1. High-risk pregnancy in second trimester 2. Supervision of high risk pregnancy, antepartum Patient reports taking ASA daily as directed.  PNV- Patient needs refill- sent to Pharmacy on file.  Plans to breast feed- discussed lactation consultant if needed  Pediatrician is   Peds PP contraception- Nexplanon Discussed Weight gain of 11-20 lbs as appropriate based on BMI  -Discussed with patient the importance nutrition/exercise, recommended daily walking and limit  simple carbohydrates and fried foods.   Discussed Expected pains of back and broad ligament   Patient measuring size > date.  If patient is still size >date at next visit.  Patient needs U/S to check growth.  - Urine Culture  Preterm labor symptoms and general obstetric precautions including but not limited to vaginal bleeding, contractions, leaking of fluid and fetal movement were reviewed in detail with the patient. Please refer to After Visit Summary for other counseling recommendations.  No follow-ups on file.  Future Appointments  Date Time Provider Department Center  04/11/2021  1:20 PM [facility] PROVIDER [facility] None    Wendi Snipes, FNP

## 2021-03-14 NOTE — Progress Notes (Signed)
Urine culture re-collected per order and counseled on how to obtain a clean catch specimen with stated understanding. Jossie Ng, RN

## 2021-03-16 LAB — URINE CULTURE

## 2021-04-11 ENCOUNTER — Ambulatory Visit: Payer: Medicaid Other | Admitting: Advanced Practice Midwife

## 2021-04-11 VITALS — BP 129/82 | HR 106 | Temp 97.6°F | Wt 280.4 lb

## 2021-04-11 DIAGNOSIS — O0990 Supervision of high risk pregnancy, unspecified, unspecified trimester: Secondary | ICD-10-CM

## 2021-04-11 DIAGNOSIS — O99212 Obesity complicating pregnancy, second trimester: Secondary | ICD-10-CM

## 2021-04-11 DIAGNOSIS — R7309 Other abnormal glucose: Secondary | ICD-10-CM

## 2021-04-11 DIAGNOSIS — O99210 Obesity complicating pregnancy, unspecified trimester: Secondary | ICD-10-CM

## 2021-04-11 DIAGNOSIS — O0992 Supervision of high risk pregnancy, unspecified, second trimester: Secondary | ICD-10-CM

## 2021-04-11 NOTE — Progress Notes (Signed)
   PRENATAL VISIT NOTE  Subjective:  Kirsten Hurley is a 30 y.o. G2P0010 at [redacted]w[redacted]d being seen today for ongoing prenatal care.  She is currently monitored for the following issues for this high-risk pregnancy and has Supervision of high risk pregnancy, antepartum; Class 2 obesity with body mass index (BMI) of 38.0 to 38.9 in adult; Rh negative state in antepartum period; Abnormal glucose tolerance test; and Obesity during pregnancy, antepartum on their problem list.  Patient reports no complaints.  Contractions: Not present. Vag. Bleeding: None.  Movement: Present. Denies leaking of fluid/ROM.   The following portions of the patient's history were reviewed and updated as appropriate: allergies, current medications, past family history, past medical history, past social history, past surgical history and problem list. Problem list updated.  Objective:   Vitals:   04/11/21 1311  BP: 129/82  Pulse: (!) 106  Temp: 97.6 F (36.4 C)  Weight: 280 lb 6.4 oz (127.2 kg)    Fetal Status:   Fundal Height: 30 cm Movement: Present     General:  Alert, oriented and cooperative. Patient is in no acute distress.  Skin: Skin is warm and dry. No rash noted.   Cardiovascular: Normal heart rate noted  Respiratory: Normal respiratory effort, no problems with respiration noted  Abdomen: Soft, gravid, appropriate for gestational age.  Pain/Pressure: Absent     Pelvic: Cervical exam deferred        Extremities: Normal range of motion.  Edema: None  Mental Status: Normal mood and affect. Normal behavior. Normal judgment and thought content.   Assessment and Plan:  Pregnancy: G2P0010 at [redacted]w[redacted]d  1. Abnormal glucose tolerance test 3 hr GTT on 01/28/21=wnl Drinking high sugar drinks and soda daily--counseled to drink only water  2. Obesity during pregnancy, antepartum 50 lb 6.4 oz (22.9 kg) Taking ASA 81 mg daily Walking 4x/wk x 30 min  3. Supervision of high risk pregnancy, antepartum S>D at last apt as  well as today MFM u/s ordered [facility] with no sonographer) Pt states last [facility] u/s done 03/03/21 at 20 2/7 with posterior placenta, 3VC, normal anatomy, AFI wnl Not working; here with FOB and strong odor of cigarette smoke in exam room (pt denies smoking) Quad neg - [place] MFM OB COMP + 14 WK; Future   Preterm labor symptoms and general obstetric precautions including but not limited to vaginal bleeding, contractions, leaking of fluid and fetal movement were reviewed in detail with the patient. Please refer to After Visit Summary for other counseling recommendations.  Return in about 3 weeks (around 05/02/2021) for 28 week labs, routine PNC.  No future appointments.  Alberteen Spindle, CNM

## 2021-04-11 NOTE — Progress Notes (Signed)
Patient here for MH RV at 25 weeks. PHQ9 and CMHRP forms today. S/S PTL reviewed and literature given.Burt Knack, RN

## 2021-04-13 ENCOUNTER — Telehealth: Payer: Self-pay

## 2021-04-13 ENCOUNTER — Telehealth: Payer: Self-pay | Admitting: Family Medicine

## 2021-04-13 NOTE — Telephone Encounter (Signed)
Patient returned phone call. Informed of scheduled MFM [place] appt for 05/05/2021 @ 3:00. Instructed to arrive at 2:45 for check in at the [facility]. Patient verbalized understanding of above. Tawny Hopping, RN

## 2021-04-13 NOTE — Telephone Encounter (Signed)
Patient called because she missed a call at home about scheduling an ultrasound. I let her know that we currently have a staff meeting and we'll call her back as soon as possible. Thank you!!

## 2021-04-13 NOTE — Telephone Encounter (Signed)
Phone call to patient at [phone] per message number has been disconnected. Phone call to alternate number of [phone]. No answr, [facility] to inform of scheduled [place] appt at [facility] MFM for 05/05/2021 at 3:00. Tawny Hopping, RN

## 2021-04-27 ENCOUNTER — Other Ambulatory Visit: Payer: Self-pay | Admitting: Advanced Practice Midwife

## 2021-04-27 DIAGNOSIS — O0990 Supervision of high risk pregnancy, unspecified, unspecified trimester: Secondary | ICD-10-CM

## 2021-04-27 DIAGNOSIS — O99212 Obesity complicating pregnancy, second trimester: Secondary | ICD-10-CM

## 2021-04-28 ENCOUNTER — Telehealth: Payer: Self-pay

## 2021-04-28 NOTE — Telephone Encounter (Signed)
Client has [facility] RV appt 05/03/2021 and will need her 28 week labs. At that appt, will need 3 hour GTT. Call to client to remind her of test prep instructions and she verbalized understanding. Jossie Ng, RN

## 2021-05-03 ENCOUNTER — Encounter: Payer: Self-pay | Admitting: Family Medicine

## 2021-05-03 ENCOUNTER — Other Ambulatory Visit: Payer: Self-pay

## 2021-05-03 ENCOUNTER — Ambulatory Visit: Payer: Medicaid Other | Admitting: Family Medicine

## 2021-05-03 VITALS — BP 128/78 | HR 102 | Temp 98.1°F | Wt 283.2 lb

## 2021-05-03 DIAGNOSIS — O0993 Supervision of high risk pregnancy, unspecified, third trimester: Secondary | ICD-10-CM | POA: Diagnosis not present

## 2021-05-03 DIAGNOSIS — O99012 Anemia complicating pregnancy, second trimester: Secondary | ICD-10-CM

## 2021-05-03 DIAGNOSIS — O99210 Obesity complicating pregnancy, unspecified trimester: Secondary | ICD-10-CM

## 2021-05-03 DIAGNOSIS — Z6791 Unspecified blood type, Rh negative: Secondary | ICD-10-CM

## 2021-05-03 DIAGNOSIS — Z23 Encounter for immunization: Secondary | ICD-10-CM

## 2021-05-03 DIAGNOSIS — O0990 Supervision of high risk pregnancy, unspecified, unspecified trimester: Secondary | ICD-10-CM

## 2021-05-03 DIAGNOSIS — O99213 Obesity complicating pregnancy, third trimester: Secondary | ICD-10-CM

## 2021-05-03 DIAGNOSIS — O99019 Anemia complicating pregnancy, unspecified trimester: Secondary | ICD-10-CM | POA: Insufficient documentation

## 2021-05-03 DIAGNOSIS — O99013 Anemia complicating pregnancy, third trimester: Secondary | ICD-10-CM

## 2021-05-03 HISTORY — DX: Anemia complicating pregnancy, unspecified trimester: O99.019

## 2021-05-03 LAB — HEMOGLOBIN, FINGERSTICK: Hemoglobin: 10.2 g/dL — ABNORMAL LOW (ref 11.1–15.9)

## 2021-05-03 MED ORDER — FERROUS SULFATE 325 (65 FE) MG PO TABS: 325.0000 mg | ORAL_TABLET | Freq: Every day | ORAL | 3 refills | Status: DC

## 2021-05-03 MED ORDER — RHO D IMMUNE GLOBULIN 1500 UNIT/2ML IJ SOSY
300.0000 ug | PREFILLED_SYRINGE | Freq: Once | INTRAMUSCULAR | Status: AC
  Administered 2021-05-03: 300 ug via INTRAMUSCULAR

## 2021-05-03 NOTE — Progress Notes (Signed)
Client presents for [facility] RV appt, but also needs 3 hour GTT. Per client, last food intake was 2000 05/02/2021 and has only had sips water since that time. Sent to lab prior to provider appt to get 3 hour GTT initiated. Jossie Ng, RN

## 2021-05-03 NOTE — Progress Notes (Signed)
   PRENATAL VISIT NOTE  Subjective:  Kirsten Hurley is a 30 y.o. G2P0010 at [redacted]w[redacted]d being seen today for ongoing prenatal care.  She is currently monitored for the following issues for this high-risk pregnancy and has Supervision of high risk pregnancy, antepartum; Class 2 obesity with body mass index (BMI) of 38.0 to 38.9 in adult; Rh negative state in antepartum period; Abnormal glucose tolerance test; Obesity during pregnancy, antepartum; and Anemia affecting pregnancy on their problem list.  Patient reports no complaints.  Contractions: Not present. Vag. Bleeding: None.  Movement: Present. Denies leaking of fluid/ROM.   The following portions of the patient's history were reviewed and updated as appropriate: allergies, current medications, past family history, past medical history, past social history, past surgical history and problem list. Problem list updated.  Objective:   Vitals:   05/03/21 0904  BP: 128/78  Pulse: (!) 102  Temp: 98.1 F (36.7 C)  Weight: 283 lb 3.2 oz (128.5 kg)    Fetal Status: Fetal Heart Rate (bpm): 142 Fundal Height: 31 cm Movement: Present     General:  Alert, oriented and cooperative. Patient is in no acute distress.  Skin: Skin is warm and dry. No rash noted.   Cardiovascular: Normal heart rate noted  Respiratory: Normal respiratory effort, no problems with respiration noted  Abdomen: Soft, gravid, appropriate for gestational age.  Pain/Pressure: Absent     Pelvic: Cervical exam deferred        Extremities: Normal range of motion.  Edema: None  Mental Status: Normal mood and affect. Normal behavior. Normal judgment and thought content.   Assessment and Plan:  Pregnancy: G2P0010 at [redacted]w[redacted]d  1. High-risk pregnancy in third trimester - Hemoglobin, venipuncture - HIV-1/HIV-2 Qualitative RNA - RPR - Glucose Tolerance Test, 6 Hour - Antibody screen - rho (d) immune globulin (RHIG/RHOPHYLAC) injection 300 mcg - Tdap vaccine greater than or equal to 7yo  IM  2. Obesity during pregnancy, antepartum - pt walking 23-30 mins 2-3 x per week  -discussed Kegel exercises to help with incontinence   3. Anemia affecting pregnancy in second trimester HGB today 10.2  Discussed taking FeSO4   - Fe+CBC/D/Plt+TIBC+Fer+Retic - ferrous sulfate 325 (65 FE) MG tablet; Take 1 tablet (325 mg total) by mouth daily with breakfast.  Dispense: 100 tablet; Refill: 3  4. Supervision of high risk pregnancy, antepartum - 28 week labs today  -discussed and recommend  taking prescribed PNV vs. Gummy PNV  -discussed cooking in cast iron skillet to help increase iron absorption.  -pt aware of U/S appt d/t S>D.  Today still measuring S >D.   - RN gave Tdap and Rhophylac today.   - will contact with results of 3 hr gtt.     Preterm labor symptoms and general obstetric precautions including but not limited to vaginal bleeding, contractions, leaking of fluid and fetal movement were reviewed in detail with the patient. Please refer to After Visit Summary for other counseling recommendations.  Return in about 2 weeks (around 05/17/2021) for routine prenatal care.  Future Appointments  Date Time Provider Department Center  05/05/2021  3:00 PM [facility]    Wendi Snipes, FNP

## 2021-05-03 NOTE — Progress Notes (Signed)
Hgb 10.2 today. Reviewed. Provider Elveria Rising, FNP made aware. Anemia protocol initiated.   Dispensed ferrous sulfate 325 to take daily.  Counseled on iron rich food and Anemia in Pregnancy pamphlet given.   Educated to take iron daily with juice that has vit C and to take separate from PNV.   Rhophylac given today.    Floy Sabina, RN

## 2021-05-04 ENCOUNTER — Other Ambulatory Visit: Payer: Self-pay | Admitting: Family Medicine

## 2021-05-04 DIAGNOSIS — O24419 Gestational diabetes mellitus in pregnancy, unspecified control: Secondary | ICD-10-CM

## 2021-05-04 LAB — FE+CBC/D/PLT+TIBC+FER+RETIC
Basophils Absolute: 0 10*3/uL (ref 0.0–0.2)
Basos: 0 %
EOS (ABSOLUTE): 0.1 10*3/uL (ref 0.0–0.4)
Eos: 1 %
Ferritin: 6 ng/mL — ABNORMAL LOW (ref 15–150)
Hematocrit: 29.8 % — ABNORMAL LOW (ref 34.0–46.6)
Hemoglobin: 10 g/dL — ABNORMAL LOW (ref 11.1–15.9)
Immature Grans (Abs): 0.2 10*3/uL — ABNORMAL HIGH (ref 0.0–0.1)
Immature Granulocytes: 2 %
Iron Saturation: 10 % — ABNORMAL LOW (ref 15–55)
Iron: 45 ug/dL (ref 27–159)
Lymphocytes Absolute: 1.9 10*3/uL (ref 0.7–3.1)
Lymphs: 15 %
MCH: 29.3 pg (ref 26.6–33.0)
MCHC: 33.6 g/dL (ref 31.5–35.7)
MCV: 87 fL (ref 79–97)
Monocytes Absolute: 1 10*3/uL — ABNORMAL HIGH (ref 0.1–0.9)
Monocytes: 8 %
Neutrophils Absolute: 9.6 10*3/uL — ABNORMAL HIGH (ref 1.4–7.0)
Neutrophils: 74 %
Platelets: 260 10*3/uL (ref 150–450)
RBC: 3.41 x10E6/uL — ABNORMAL LOW (ref 3.77–5.28)
RDW: 13.5 % (ref 11.7–15.4)
Retic Ct Pct: 2 % (ref 0.6–2.6)
Total Iron Binding Capacity: 440 ug/dL (ref 250–450)
UIBC: 395 ug/dL (ref 131–425)
WBC: 13 10*3/uL — ABNORMAL HIGH (ref 3.4–10.8)

## 2021-05-04 LAB — ANTIBODY SCREEN: Antibody Screen: NEGATIVE

## 2021-05-04 LAB — GLUCOSE TOLERANCE TEST, 6 HOUR
Glucose, 1 Hour GTT: 186 mg/dL (ref 65–199)
Glucose, 2 hour: 164 mg/dL — ABNORMAL HIGH (ref 65–139)
Glucose, 3 hour: 93 mg/dL (ref 65–109)
Glucose, GTT - Fasting: 90 mg/dL (ref 65–99)

## 2021-05-04 LAB — RPR: RPR Ser Ql: NONREACTIVE

## 2021-05-04 LAB — HIV-1/HIV-2 QUALITATIVE RNA
HIV-1 RNA, Qualitative: NONREACTIVE
HIV-2 RNA, Qualitative: NONREACTIVE

## 2021-05-04 MED ORDER — BLOOD GLUCOSE METER KIT: PACK | 0 refills | Status: DC

## 2021-05-05 ENCOUNTER — Other Ambulatory Visit: Payer: Self-pay

## 2021-05-05 ENCOUNTER — Ambulatory Visit: Payer: Medicaid Other | Attending: Maternal & Fetal Medicine

## 2021-05-05 DIAGNOSIS — O0993 Supervision of high risk pregnancy, unspecified, third trimester: Secondary | ICD-10-CM | POA: Diagnosis present

## 2021-05-05 DIAGNOSIS — O24419 Gestational diabetes mellitus in pregnancy, unspecified control: Secondary | ICD-10-CM | POA: Diagnosis not present

## 2021-05-05 DIAGNOSIS — O99212 Obesity complicating pregnancy, second trimester: Secondary | ICD-10-CM

## 2021-05-05 DIAGNOSIS — Z3A28 28 weeks gestation of pregnancy: Secondary | ICD-10-CM | POA: Insufficient documentation

## 2021-05-05 DIAGNOSIS — O99213 Obesity complicating pregnancy, third trimester: Secondary | ICD-10-CM | POA: Insufficient documentation

## 2021-05-05 DIAGNOSIS — O0990 Supervision of high risk pregnancy, unspecified, unspecified trimester: Secondary | ICD-10-CM

## 2021-05-06 ENCOUNTER — Encounter: Payer: Self-pay | Admitting: Advanced Practice Midwife

## 2021-05-09 ENCOUNTER — Telehealth: Payer: Self-pay

## 2021-05-09 NOTE — Telephone Encounter (Signed)
Call to Inetta Fermo at [facility] to ascertain status of appt for diabetes education. Per Inetta Fermo, priority marked as routine and not urgent so client not yet called for appt. Inetta Fermo states she will call Inetta Fermo now to schedule appt. Jossie Ng, RN

## 2021-05-09 NOTE — Telephone Encounter (Signed)
Per Epic appt desk, client has Diabetes Education appt for 05/12/2021 at 1315. Jossie Ng, RN

## 2021-05-12 ENCOUNTER — Encounter: Payer: Self-pay | Admitting: *Deleted

## 2021-05-12 ENCOUNTER — Other Ambulatory Visit: Payer: Self-pay

## 2021-05-12 ENCOUNTER — Encounter: Payer: Medicaid Other | Attending: Family Medicine | Admitting: *Deleted

## 2021-05-12 VITALS — BP 100/70 | Ht 66.0 in | Wt 280.5 lb

## 2021-05-12 DIAGNOSIS — Z3A00 Weeks of gestation of pregnancy not specified: Secondary | ICD-10-CM | POA: Diagnosis not present

## 2021-05-12 DIAGNOSIS — O24419 Gestational diabetes mellitus in pregnancy, unspecified control: Secondary | ICD-10-CM | POA: Diagnosis present

## 2021-05-12 DIAGNOSIS — O24410 Gestational diabetes mellitus in pregnancy, diet controlled: Secondary | ICD-10-CM

## 2021-05-12 MED ORDER — BLOOD GLUCOSE METER KIT: PACK | 0 refills | Status: DC

## 2021-05-12 NOTE — Progress Notes (Signed)
Diabetes Self-Management Education  Visit Type: First/Initial  Appt. Start Time: 1310 Appt. End Time: 1435  05/12/2021  Kirsten Hurley, identified by name and date of birth, is a 30 y.o. female with a diagnosis of Diabetes: Gestational Diabetes.   ASSESSMENT  Blood pressure 100/70, height 5\' 6"  (1.676 m), weight 280 lb 8 oz (127.2 kg), last menstrual period 10/18/2020, estimated date of delivery 07/25/2021 Body mass index is 45.27 kg/m.   Diabetes Self-Management Education - 05/12/21 1416       Visit Information   Visit Type First/Initial      Initial Visit   Diabetes Type Gestational Diabetes    Are you currently following a meal plan? Yes    What type of meal plan do you follow? "cut out most sugars"    Are you taking your medications as prescribed? Yes    Date Diagnosed one week      Health Coping   How would you rate your overall health? Fair      Psychosocial Assessment   Patient Belief/Attitude about Diabetes Other (comment)   "Sad"   Self-care barriers None    Self-management support Doctor's office;Friends    Other persons present Spouse/SO    Patient Concerns Nutrition/Meal planning;Glycemic Control;Monitoring;Weight Control;Healthy Lifestyle    Special Needs None    Preferred Learning Style Auditory;Visual;Hands on    Learning Readiness Ready    How often do you need to have someone help you when you read instructions, pamphlets, or other written materials from your doctor or pharmacy? 1 - Never    What is the last grade level you completed in school? 9th grade      Pre-Education Assessment   Patient understands the diabetes disease and treatment process. Needs Instruction    Patient understands incorporating nutritional management into lifestyle. Needs Instruction    Patient undertands incorporating physical activity into lifestyle. Needs Instruction    Patient understands using medications safely. Needs Instruction    Patient understands monitoring blood  glucose, interpreting and using results Needs Instruction    Patient understands prevention, detection, and treatment of acute complications. Needs Instruction    Patient understands prevention, detection, and treatment of chronic complications. Needs Instruction    Patient understands how to develop strategies to address psychosocial issues. Needs Instruction    Patient understands how to develop strategies to promote health/change behavior. Needs Instruction      Complications   Last HgB A1C per patient/outside source 4.8 %   01/17/2021   How often do you check your blood sugar? 0 times/day (not testing)   Provided Accu-Chek Guide Me meter and instructed on use. BG upon return demonstration was 81 mg/dL at 01/19/2021 pm - fasting.   Have you had a dilated eye exam in the past 12 months? No    Have you had a dental exam in the past 12 months? No    Are you checking your feet? Yes    How many days per week are you checking your feet? 7      Dietary Intake   Breakfast cereal and milk; 1:61 toast and cheese omelet; bagel with cream cheese    Snack (morning) chips    Lunch Ramen noodles, boiled eggs, chicken salad on bread ror crackers    Dinner steak, chicken, hot dogs, pork; potatoes, peas, corn, occasional beans, rice, pasta, broccoli, raw carrots, lettuce tomaotes, cuccumbers with Ranch dressing    Snack (evening) watermelon or cereal an milk    Beverage(s) water, juice,  soda, Gatorade, Crystal light      Exercise   Exercise Type Light (walking / raking leaves)    How many days per week to you exercise? 4    How many minutes per day do you exercise? 45    Total minutes per week of exercise 180      Patient Education   Previous Diabetes Education No    Disease state  Definition of diabetes, type 1 and 2, and the diagnosis of diabetes;Factors that contribute to the development of diabetes    Nutrition management  Role of diet in the treatment of diabetes and the relationship between the  three main macronutrients and blood glucose level;Food label reading, portion sizes and measuring food.;Reviewed blood glucose goals for pre and post meals and how to evaluate the patients' food intake on their blood glucose level.    Physical activity and exercise  Role of exercise on diabetes management, blood pressure control and cardiac health.    Medications Other (comment)   Limited use of oral medications during pregnancy and potential for insulin.   Monitoring Taught/evaluated SMBG meter.;Purpose and frequency of SMBG.;Taught/discussed recording of test results and interpretation of SMBG.;Identified appropriate SMBG and/or A1C goals.;Ketone testing, when, how.    Chronic complications Relationship between chronic complications and blood glucose control    Psychosocial adjustment Identified and addressed patients feelings and concerns about diabetes    Preconception care Pregnancy and GDM  Role of pre-pregnancy blood glucose control on the development of the fetus;Reviewed with patient blood glucose goals with pregnancy;Role of family planning for patients with diabetes      Individualized Goals (developed by patient)   Reducing Risk Other (comment)   improve blood sugars, prevent diabetes complications, lose weight, lead a healthier lifestyle     Outcomes   Expected Outcomes Demonstrated interest in learning. Expect positive outcomes             Individualized Plan for Diabetes Self-Management Training:   Learning Objective:  Patient will have a greater understanding of diabetes self-management. Patient education plan is to attend individual and/or group sessions per assessed needs and concerns.   Plan:   Patient Instructions  Read booklet on Gestational Diabetes Follow Gestational Meal Planning Guidelines Avoid cold cereal for breakfast Avoid fruit juices and sugar sweetened drinks (soda, Gatorade) Limit fried foods Complete a 3 Day Food Record and bring to next  appointment Check blood sugars 4 x day - before breakfast and 2 hrs after every meal and record  Bring blood sugar log to all appointments Call MD for prescription for meter strips and lancets Strips   Accu-Chek Guide Lancets   Accu-Chek Softclix Purchase urine ketone strips if instructed by MD and check urine ketones every am:  If + increase bedtime snack to 1 protein and 2 carbohydrate servings Walk 20-30 minutes at least 5 x week if permitted by MD  Expected Outcomes:  Demonstrated interest in learning. Expect positive outcomes  Education material provided:  Gestational Booklet Gestational Meal Planning Guidelines Simple Meal Plan Viewed Gestational Diabetes Video Meter = Accu-Chek Guide 3 Day Food Record Goals for a Healthy Pregnancy   If problems or questions, patient to contact team via:  Sharion Settler, RN, CCM, CDCES [phone]  Future DSME appointment:  May 16, 2021 with the dietitian

## 2021-05-12 NOTE — Addendum Note (Signed)
Addended by: Wendi Snipes on: 05/12/2021 05:29 PM   Modules accepted: Orders

## 2021-05-12 NOTE — Patient Instructions (Signed)
Read booklet on Gestational Diabetes Follow Gestational Meal Planning Guidelines Avoid cold cereal for breakfast Avoid fruit juices and sugar sweetened drinks (soda, Gatorade) Limit fried foods Complete a 3 Day Food Record and bring to next appointment Check blood sugars 4 x day - before breakfast and 2 hrs after every meal and record  Bring blood sugar log to all appointments Call MD for prescription for meter strips and lancets Strips   Accu-Chek Guide  Lancets   Accu-Chek Softclix Purchase urine ketone strips if instructed by MD and check urine ketones every am:  If + increase bedtime snack to 1 protein and 2 carbohydrate servings Walk 20-30 minutes at least 5 x week if permitted by MD

## 2021-05-16 ENCOUNTER — Other Ambulatory Visit: Payer: Self-pay

## 2021-05-16 ENCOUNTER — Encounter: Payer: Self-pay | Admitting: Dietician

## 2021-05-16 ENCOUNTER — Encounter: Payer: Medicaid Other | Admitting: Dietician

## 2021-05-16 VITALS — BP 112/70 | Ht 66.0 in | Wt 281.8 lb

## 2021-05-16 DIAGNOSIS — O24419 Gestational diabetes mellitus in pregnancy, unspecified control: Secondary | ICD-10-CM | POA: Diagnosis not present

## 2021-05-16 DIAGNOSIS — O24410 Gestational diabetes mellitus in pregnancy, diet controlled: Secondary | ICD-10-CM

## 2021-05-16 NOTE — Progress Notes (Signed)
Patient's BG record indicates fasting BGs ranging 81-94 , and post-meal BGs ranging 83-120 with 1 of 12 readings at 137 after fast food meal with regular soda. Patient's food diary indicates she is eating 3 meals daily; snacks only occasionally. She denies and symptoms/ episodes of hypoglycemia. Meals include protein source; some meals lacking a vegetable or fruit. Some meals are higher in carb than the goal. Provided basic balanced meal plan, and wrote individualized menus based on patient's food preferences. Discussed and listed options for healthy snacks. Encouraged increase in vegetables with meals or snacks. Instructed patient on food safety, including avoidance of Listeriosis, and limiting mercury from fish. Discussed importance of maintaining healthy lifestyle habits to reduce risk of Type 2 DM as well as Gestational DM with any future pregnancies. Advised patient to use any remaining testing supplies to test some BGs after delivery, and to have BG tested ideally annually, as well as prior to attempting future pregnancies.

## 2021-05-17 ENCOUNTER — Ambulatory Visit: Payer: Medicaid Other | Admitting: Family Medicine

## 2021-05-17 VITALS — BP 114/74 | HR 101 | Temp 97.8°F | Wt 281.4 lb

## 2021-05-17 DIAGNOSIS — O0993 Supervision of high risk pregnancy, unspecified, third trimester: Secondary | ICD-10-CM

## 2021-05-17 DIAGNOSIS — O24419 Gestational diabetes mellitus in pregnancy, unspecified control: Secondary | ICD-10-CM

## 2021-05-17 LAB — URINALYSIS
Bilirubin, UA: NEGATIVE
Glucose, UA: NEGATIVE
Ketones, UA: NEGATIVE
Nitrite, UA: NEGATIVE
Protein,UA: NEGATIVE
RBC, UA: NEGATIVE
Specific Gravity, UA: 1.025 (ref 1.005–1.030)
Urobilinogen, Ur: 0.2 mg/dL (ref 0.2–1.0)
pH, UA: 6 (ref 5.0–7.5)

## 2021-05-17 NOTE — Progress Notes (Signed)
Correctly verbalizes how to take PNV and iron tablet. Taking iron tablet with small cup of juice. Kept 05/16/2021 [facility] appt. Blood sugar log copied and in outguide for provider review. Urine dip today. Jossie Ng, RN Urine dip reviewed - trace leukocytes only. Jossie Ng, RN

## 2021-05-17 NOTE — Progress Notes (Signed)
   PRENATAL VISIT NOTE  Subjective:  Kirsten Hurley is a 30 y.o. G2P0010 at 53w1dbeing seen today for ongoing prenatal care.  She is currently monitored for the following issues for this high-risk pregnancy and has Supervision of high risk pregnancy, antepartum; Class 2 obesity with body mass index (BMI) of 38.0 to 38.9 in adult; Rh negative state in antepartum period; Abnormal glucose tolerance test; Obesity during pregnancy, antepartum; Anemia affecting pregnancy; and Gestational diabetes mellitus (GDM) affecting pregnancy on their problem list.  Patient reports no complaints.  Contractions: Not present. Vag. Bleeding: None.  Movement: Present. Denies leaking of fluid/ROM.   The following portions of the patient's history were reviewed and updated as appropriate: allergies, current medications, past family history, past medical history, past social history, past surgical history and problem list. Problem list updated.  Objective:   Vitals:   05/17/21 1314  BP: 114/74  Pulse: (!) 101  Temp: 97.8 F (36.6 C)  Weight: 281 lb 6.4 oz (127.6 kg)    Fetal Status: Fetal Heart Rate (bpm): 140 Fundal Height: 33 cm Movement: Present     General:  Alert, oriented and cooperative. Patient is in no acute distress.  Skin: Skin is warm and dry. No rash noted.   Cardiovascular: Normal heart rate noted  Respiratory: Normal respiratory effort, no problems with respiration noted  Abdomen: Soft, gravid, appropriate for gestational age.  Pain/Pressure: Absent     Pelvic: Cervical exam deferred        Extremities: Normal range of motion.  Edema: Trace  Mental Status: Normal mood and affect. Normal behavior. Normal judgment and thought content.   Assessment and Plan:  Pregnancy: G2P0010 at 374w1d1. High-risk pregnancy in third trimester -Reviewed labs from 28 wk visit, results  -As part of routine education we reviewed s/sx of pre-eclampsia and to seek medical care ASAP if present. -PP  contraception plan is nexplanon  -Feeding plan is breast  - Urinalysis (Urine Dip)  2. Gestational diabetes mellitus (GDM) affecting pregnancy  -Blood sugar log values below, good  control. Encouraged continued daily exercise and diet modifications.  0 of 6 FBS values are abnormal, range <95 1 of 16 2hr pp values are abnormal, range <120 Exercise: none  Recent food: Breakfast = spam and cheese omelet on bun            Lunch = pizza  (1 slice)  [employer] creaser's            Dinner =chicken thigh, mac and cheese and black eye peas            Snack = none  Water:  84.5 oz  -Urine results pending .  -Will possibly need IOL at 39 wks, plan to complete paperwork at 38 wks.   Preterm labor symptoms and general obstetric precautions including but not limited to vaginal bleeding, contractions, leaking of fluid and fetal movement were reviewed in detail with the patient. Please refer to After Visit Summary for other counseling recommendations.  Return in about 2 weeks (around 05/31/2021) for routine prenatal care.  Future Appointments  Date Time Provider [place]6/28/2022  1:20 PM [facility] PROVIDER [facility] None    MeJunious DresserFNP

## 2021-05-17 NOTE — Addendum Note (Signed)
Addended by: Heywood Bene on: 05/17/2021 12:03 PM   Modules accepted: Orders

## 2021-05-25 ENCOUNTER — Encounter: Payer: Self-pay | Admitting: Advanced Practice Midwife

## 2021-05-31 ENCOUNTER — Ambulatory Visit: Payer: Medicaid Other | Admitting: Family Medicine

## 2021-05-31 ENCOUNTER — Other Ambulatory Visit: Payer: Self-pay

## 2021-05-31 VITALS — BP 129/72 | HR 91 | Temp 97.4°F | Wt 278.6 lb

## 2021-05-31 DIAGNOSIS — O24419 Gestational diabetes mellitus in pregnancy, unspecified control: Secondary | ICD-10-CM

## 2021-05-31 DIAGNOSIS — O0990 Supervision of high risk pregnancy, unspecified, unspecified trimester: Secondary | ICD-10-CM

## 2021-05-31 DIAGNOSIS — O99012 Anemia complicating pregnancy, second trimester: Secondary | ICD-10-CM

## 2021-05-31 LAB — URINALYSIS
Bilirubin, UA: NEGATIVE
Glucose, UA: NEGATIVE
Ketones, UA: NEGATIVE
Nitrite, UA: NEGATIVE
Protein,UA: NEGATIVE
RBC, UA: NEGATIVE
Specific Gravity, UA: 1.02 (ref 1.005–1.030)
Urobilinogen, Ur: 0.2 mg/dL (ref 0.2–1.0)
pH, UA: 7 (ref 5.0–7.5)

## 2021-05-31 LAB — HEMOGLOBIN, FINGERSTICK: Hemoglobin: 11.7 g/dL (ref 11.1–15.9)

## 2021-05-31 MED ORDER — CLOTRIMAZOLE 1 % VA CREA: 1.0000 | TOPICAL_CREAM | Freq: Every day | VAGINAL | 0 refills | Status: DC

## 2021-05-31 NOTE — Progress Notes (Signed)
   PRENATAL VISIT NOTE  Subjective:  Kirsten Hurley is a 30 y.o. G2P0010 at [redacted]w[redacted]d being seen today for ongoing prenatal care.  She is currently monitored for the following issues for this high-risk pregnancy and has Supervision of high risk pregnancy, antepartum; Class 2 obesity with body mass index (BMI) of 38.0 to 38.9 in adult; Rh negative state in antepartum period; Abnormal glucose tolerance test; Obesity during pregnancy, antepartum; Anemia affecting pregnancy; and Gestational diabetes mellitus (GDM) affecting pregnancy on their problem list.  Patient reports no complaints.  Contractions: Not present. Vag. Bleeding: None.  Movement: Present. Denies leaking of fluid/ROM.   The following portions of the patient's history were reviewed and updated as appropriate: allergies, current medications, past family history, past medical history, past social history, past surgical history and problem list. Problem list updated.  Objective:   Vitals:   05/31/21 1311  BP: 129/72  Pulse: 91  Temp: (!) 97.4 F (36.3 C)  Weight: 278 lb 9.6 oz (126.4 kg)    Fetal Status: Fetal Heart Rate (bpm): 135 Fundal Height: 35 cm Movement: Present     General:  Alert, oriented and cooperative. Patient is in no acute distress.  Skin: Skin is warm and dry. No rash noted.   Cardiovascular: Normal heart rate noted  Respiratory: Normal respiratory effort, no problems with respiration noted  Abdomen: Soft, gravid, appropriate for gestational age.  Pain/Pressure: Absent     Pelvic: Cervical exam deferred        Extremities: Normal range of motion.  Edema: Trace  Mental Status: Normal mood and affect. Normal behavior. Normal judgment and thought content.   Assessment and Plan:  Pregnancy: G2P0010 at [redacted]w[redacted]d  1. Gestational diabetes mellitus (GDM) affecting pregnancy -Blood sugar log values below, good  control. Encouraged continued daily exercise and diet modifications.  0 of 20 FBS values are abnormal, range  81-94 6 of 60 2hr pp values are abnormal, range 65-161 Exercise: "moving more"  Recent food: Breakfast = sausage gravy breakfast bowl Runner, broadcasting/film/video)            Lunch = uncrustable sandwich light and fit greek Retail banker =beef tips, 1/2 cup rice white, peas, zucchini           Snack = yogurt, [employer] , peanut crackers  Water: 67.9 oz  -Urine results WNL.  -Ultrasound will schedule at 36 weeks. -Will need IOL at 39 wks, plan to complete paperwork at 38 wks.   - Urinalysis (Urine Dip)  2. Anemia affecting pregnancy in second trimester 11.7 g  - Hemoglobin, fingerstick  3. Supervision of high risk pregnancy, antepartum Reviewed patient BG log, meals, and exercise.  Taking PNV and ASA as directed  Reviewed U/S, follow normal anatomy with measurements consistent with dates. There is good fetal movement and amniotic fluid volume. Follow up in 4 weeks.  Currently no appt. Scheduled.    Preterm labor symptoms and general obstetric precautions including but not limited to vaginal bleeding, contractions, leaking of fluid and fetal movement were reviewed in detail with the patient. Please refer to After Visit Summary for other counseling recommendations.  Return in about 2 weeks (around 06/14/2021) for routine prenatal care.  Future Appointments  Date Time Provider Department Center  06/14/2021  1:40 PM [facility] PROVIDER [facility] None    Wendi Snipes, FNP

## 2021-05-31 NOTE — Progress Notes (Signed)
Hgb WNL.  Urine dip WNL.  Reviewed during clinic visit with provider, Elveria Rising, FNP.   Floy Sabina, RN

## 2021-06-01 ENCOUNTER — Other Ambulatory Visit: Payer: Self-pay | Admitting: Family Medicine

## 2021-06-01 ENCOUNTER — Telehealth: Payer: Self-pay

## 2021-06-01 DIAGNOSIS — O24419 Gestational diabetes mellitus in pregnancy, unspecified control: Secondary | ICD-10-CM

## 2021-06-01 MED ORDER — ACCU-CHEK GUIDE VI STRP: ORAL_STRIP | 12 refills | Status: DC

## 2021-06-01 NOTE — Telephone Encounter (Signed)
Contacted patient regarding f/u ultrasound. She states she does not have appointment. Will follow up with provider, Elveria Rising, FNP to have ultrasound order.   Floy Sabina, RN

## 2021-06-01 NOTE — Addendum Note (Signed)
Addended by: Floy Sabina on: 06/01/2021 03:22 PM   Modules accepted: Orders

## 2021-06-02 ENCOUNTER — Telehealth: Payer: Self-pay

## 2021-06-02 NOTE — Telephone Encounter (Signed)
Patient had an U/S 05/05/21 with [facility] that need a 4 week f/u but not scheduled.    [facility] is booked until August. So they put her on the schedule to get her ultrasound done in the main ultrasound of the hospital on their clinic day so that MFM will be right here in their office and be able to read her ultrasound right in the hospital the same day.   Her appt time and date is. 06/09/21 @ 0830 in the [facility] of the [facility] Ultrasound [facility].   Patient aware it won't be in their office but that Dr. Parke Poisson will still look at it from their office and even come over there when its complete.   She can only have 1 support person with her for the last 15 minutes of the appt and no children allowed in the hospital with her. She should be at the appt 15 minutes before her appt time. [facility] does not have any openings in the schedule until Mid August so very importnt she attends.    Floy Sabina, RN

## 2021-06-07 ENCOUNTER — Other Ambulatory Visit: Payer: Self-pay | Admitting: Advanced Practice Midwife

## 2021-06-07 DIAGNOSIS — O99213 Obesity complicating pregnancy, third trimester: Secondary | ICD-10-CM

## 2021-06-07 DIAGNOSIS — O24419 Gestational diabetes mellitus in pregnancy, unspecified control: Secondary | ICD-10-CM

## 2021-06-09 ENCOUNTER — Other Ambulatory Visit: Payer: Self-pay

## 2021-06-09 ENCOUNTER — Ambulatory Visit: Payer: Medicaid Other | Attending: Obstetrics

## 2021-06-09 VITALS — BP 134/82 | HR 112 | Temp 97.8°F | Resp 20 | Ht 66.0 in | Wt 281.0 lb

## 2021-06-09 DIAGNOSIS — O24410 Gestational diabetes mellitus in pregnancy, diet controlled: Secondary | ICD-10-CM | POA: Diagnosis not present

## 2021-06-09 DIAGNOSIS — Z3A33 33 weeks gestation of pregnancy: Secondary | ICD-10-CM

## 2021-06-09 DIAGNOSIS — O99213 Obesity complicating pregnancy, third trimester: Secondary | ICD-10-CM | POA: Diagnosis not present

## 2021-06-09 DIAGNOSIS — O26843 Uterine size-date discrepancy, third trimester: Secondary | ICD-10-CM | POA: Insufficient documentation

## 2021-06-09 DIAGNOSIS — O24419 Gestational diabetes mellitus in pregnancy, unspecified control: Secondary | ICD-10-CM | POA: Insufficient documentation

## 2021-06-09 DIAGNOSIS — R7309 Other abnormal glucose: Secondary | ICD-10-CM

## 2021-06-09 DIAGNOSIS — O99210 Obesity complicating pregnancy, unspecified trimester: Secondary | ICD-10-CM

## 2021-06-09 DIAGNOSIS — O0990 Supervision of high risk pregnancy, unspecified, unspecified trimester: Secondary | ICD-10-CM

## 2021-06-09 DIAGNOSIS — O26899 Other specified pregnancy related conditions, unspecified trimester: Secondary | ICD-10-CM

## 2021-06-10 ENCOUNTER — Encounter: Payer: Self-pay | Admitting: Advanced Practice Midwife

## 2021-06-14 ENCOUNTER — Ambulatory Visit: Payer: Medicaid Other | Admitting: Advanced Practice Midwife

## 2021-06-14 ENCOUNTER — Telehealth: Payer: Self-pay

## 2021-06-14 ENCOUNTER — Other Ambulatory Visit: Payer: Self-pay

## 2021-06-14 VITALS — BP 126/78 | HR 89 | Temp 97.2°F | Wt 282.0 lb

## 2021-06-14 DIAGNOSIS — O99210 Obesity complicating pregnancy, unspecified trimester: Secondary | ICD-10-CM

## 2021-06-14 DIAGNOSIS — O24419 Gestational diabetes mellitus in pregnancy, unspecified control: Secondary | ICD-10-CM

## 2021-06-14 DIAGNOSIS — O0990 Supervision of high risk pregnancy, unspecified, unspecified trimester: Secondary | ICD-10-CM

## 2021-06-14 DIAGNOSIS — O99012 Anemia complicating pregnancy, second trimester: Secondary | ICD-10-CM

## 2021-06-14 LAB — URINALYSIS
Bilirubin, UA: NEGATIVE
Glucose, UA: NEGATIVE
Ketones, UA: NEGATIVE
Nitrite, UA: NEGATIVE
Protein,UA: NEGATIVE
RBC, UA: NEGATIVE
Specific Gravity, UA: 1.015 (ref 1.005–1.030)
Urobilinogen, Ur: 0.2 mg/dL (ref 0.2–1.0)
pH, UA: 7 (ref 5.0–7.5)

## 2021-06-14 MED ORDER — BLOOD GLUCOSE TEST VI STRP
1.0000 [IU] | ORAL_STRIP | Freq: Four times a day (QID) | 12 refills | Status: DC
Start: 2021-06-14 — End: 2021-07-21

## 2021-06-14 NOTE — Telephone Encounter (Signed)
Patient called stating that her insurance, Medicaid, will not cover test strips until the 27th. She states she has only 18 left. Called to see if we can update prescriptions.   Floy Sabina, RN

## 2021-06-14 NOTE — Progress Notes (Signed)
PRENATAL VISIT NOTE  Subjective:  Kirsten Hurley is a 30 y.o. G2P0010 at [redacted]w[redacted]d being seen today for ongoing prenatal care.  She is currently monitored for the following issues for this high-risk pregnancy and has Supervision of high risk pregnancy, antepartum; Rh negative state in antepartum period; Abnormal glucose tolerance test; Obesity during pregnancy, antepartum BMI=45.5; Anemia affecting pregnancy; and Gestational diabetes mellitus 05/03/21 on their problem list.  Patient reports no complaints.  Contractions: Not present. Vag. Bleeding: None.  Movement: Present. Denies leaking of fluid/ROM.   The following portions of the patient's history were reviewed and updated as appropriate: allergies, current medications, past family history, past medical history, past social history, past surgical history and problem list. Problem list updated.  Objective:   Vitals:   06/14/21 1350  BP: 126/78  Pulse: 89  Temp: (!) 97.2 F (36.2 C)  Weight: 282 lb (127.9 kg)    Fetal Status: Fetal Heart Rate (bpm): 140 Fundal Height: 38 cm Movement: Present     General:  Alert, oriented and cooperative. Patient is in no acute distress.  Skin: Skin is warm and dry. No rash noted.   Cardiovascular: Normal heart rate noted  Respiratory: Normal respiratory effort, no problems with respiration noted  Abdomen: Soft, gravid, appropriate for gestational age.  Pain/Pressure: Absent     Pelvic: Cervical exam deferred        Extremities: Normal range of motion.  Edema: Trace  Mental Status: Normal mood and affect. Normal behavior. Normal judgment and thought content.   Assessment and Plan:  Pregnancy: G2P0010 at [redacted]w[redacted]d  1. Gestational diabetes mellitus (GDM) affecting pregnancy Blood sugar log values below. Encouraged continued daily exercise and diet modifications. 0 of  6 FBS values are abnormal (range was 89 to 93) 0 of 16 2hr pp values are abnormal (range was 81 to 107) Exercise: walks 10. minutes  4x/wk Diet recall :   Breakfast = 1 protein bowl from [employer] (egg, potato, sausage, gravy), water                      Snack=1 granola bar, water                      Lunch = not today                      Dinner = 1 pork chop, green beans, scalloped potatoes, water                      Snack = sugar free jello                      Lunch yesterday: 1 uncrustable peanut butter sandwiches, sugar free jello, water [employer] apt 05/12/21 and 05/16/21 Ketones neg, glucose neg  Water: drinks about  4-5 16.9 oz bottles of water/day and night  - Urinalysis (Urine Dip)  2. Supervision of high risk pregnancy, antepartum Reviewed 05/05/21 u/s at 29 3/7 with AFI wnl, anatomy wnl, EFW=72% Reviewed 06/09/21 at 34 4/7 with AFI wnl, growth wnl, EFW=77%, right lateral placenta Next growth u/s 07/07/21 S>D today  3. Anemia affecting pregnancy in second trimester Taking I FeSo4 daily with oj  4. Obesity during pregnancy, antepartum BMI=45.5 52 lb (23.6 kg) Walking 4x/wk x 10 min; to increase to 20 min Taking ASA 81 mg daily   Preterm labor symptoms and general obstetric precautions including but  not limited to vaginal bleeding, contractions, leaking of fluid and fetal movement were reviewed in detail with the patient. Please refer to After Visit Summary for other counseling recommendations.  Return in about 1 week (around 06/21/2021) for routine PNC.  Future Appointments  Date Time Provider Department Center  07/07/2021  7:30 AM [facility]    Alberteen Spindle, CNM

## 2021-06-21 ENCOUNTER — Ambulatory Visit: Payer: Medicaid Other | Admitting: Advanced Practice Midwife

## 2021-06-21 ENCOUNTER — Other Ambulatory Visit: Payer: Self-pay

## 2021-06-21 VITALS — BP 109/69 | HR 92 | Temp 97.2°F | Wt 282.6 lb

## 2021-06-21 DIAGNOSIS — O0990 Supervision of high risk pregnancy, unspecified, unspecified trimester: Secondary | ICD-10-CM

## 2021-06-21 DIAGNOSIS — O24419 Gestational diabetes mellitus in pregnancy, unspecified control: Secondary | ICD-10-CM

## 2021-06-21 DIAGNOSIS — O99012 Anemia complicating pregnancy, second trimester: Secondary | ICD-10-CM

## 2021-06-21 DIAGNOSIS — O99210 Obesity complicating pregnancy, unspecified trimester: Secondary | ICD-10-CM

## 2021-06-21 LAB — URINALYSIS
Bilirubin, UA: NEGATIVE
Glucose, UA: NEGATIVE
Nitrite, UA: NEGATIVE
Protein,UA: NEGATIVE
RBC, UA: NEGATIVE
Specific Gravity, UA: 1.02 (ref 1.005–1.030)
Urobilinogen, Ur: 0.2 mg/dL (ref 0.2–1.0)
pH, UA: 7 (ref 5.0–7.5)

## 2021-06-21 NOTE — Progress Notes (Signed)
Patient here for MH RV at 35w 1 d. Patient brought blood sugar log. Copy made and given to provider. Fetal kick counts brought today.   Urine dip reviewed today.   Patient aware of follow-up MFM growth ultrasound on 07/07/21 at 0730.   Floy Sabina, RN

## 2021-06-21 NOTE — Progress Notes (Signed)
   PRENATAL VISIT NOTE  Subjective:  Kirsten Hurley is a 30 y.o. G2P0010 at [redacted]w[redacted]d being seen today for ongoing prenatal care.  She is currently monitored for the following issues for this high-risk pregnancy and has Supervision of high risk pregnancy, antepartum; Rh negative state in antepartum period; Abnormal glucose tolerance test; Obesity during pregnancy, antepartum BMI=45.5; Anemia affecting pregnancy; and Gestational diabetes mellitus 05/03/21 on their problem list.  Patient reports no complaints.  Contractions: Not present. Vag. Bleeding: None.  Movement: Present. Denies leaking of fluid/ROM.   The following portions of the patient's history were reviewed and updated as appropriate: allergies, current medications, past family history, past medical history, past social history, past surgical history and problem list. Problem list updated.  Objective:   Vitals:   06/21/21 0855  BP: 109/69  Pulse: 92  Temp: (!) 97.2 F (36.2 C)  Weight: 282 lb 9.6 oz (128.2 kg)    Fetal Status: Fetal Heart Rate (bpm): 120 Fundal Height: 38 cm Movement: Present     General:  Alert, oriented and cooperative. Patient is in no acute distress.  Skin: Skin is warm and dry. No rash noted.   Cardiovascular: Normal heart rate noted  Respiratory: Normal respiratory effort, no problems with respiration noted  Abdomen: Soft, gravid, appropriate for gestational age.  Pain/Pressure: Absent     Pelvic: Cervical exam deferred        Extremities: Normal range of motion.  Edema: Trace  Mental Status: Normal mood and affect. Normal behavior. Normal judgment and thought content.   Assessment and Plan:  Pregnancy: G2P0010 at [redacted]w[redacted]d  1. Gestational diabetes mellitus (GDM) affecting pregnancy Blood sugar log values below. Encouraged continued daily exercise and diet modifications. 0 of  8 FBS values are abnormal (range was 86 to 92) 1 of 21 2hr pp values are abnormal (range was 79 to 120) Exercise: walks 15-20  minutes 4-5x/wk Diet recall :   Breakfast = 2 mini sausage bisquits, water                      Lunch = 1 cheese hot pocket, water                      Dinner = 2 soft tacos with sour cream, meat, water                      Snack = 1 granola bar with water Water: drinks about  5 bottles of water/day and night  - Urinalysis (Urine Dip)  2. Obesity during pregnancy, antepartum BMI=45.5 52 lb 9.6 oz (23.9 kg) Taking ASA 81 mg daily Walking 4-5x/wk x 20 min  3. Supervision of high risk pregnancy, antepartum Has car seat Has f/u growth u/s 07/07/21 4. Anemia affecting pregnancy in second trimester Taking I FeSo4 daily with oj   Preterm labor symptoms and general obstetric precautions including but not limited to vaginal bleeding, contractions, leaking of fluid and fetal movement were reviewed in detail with the patient. Please refer to After Visit Summary for other counseling recommendations.  Return in about 1 week (around 06/28/2021) for routine PNC.  Future Appointments  Date Time Provider Department Center  07/07/2021  7:30 AM [facility]    Alberteen Spindle, CNM

## 2021-06-28 ENCOUNTER — Other Ambulatory Visit: Payer: Self-pay

## 2021-06-28 ENCOUNTER — Ambulatory Visit: Payer: Medicaid Other | Admitting: Family Medicine

## 2021-06-28 VITALS — BP 119/78 | HR 93 | Temp 97.9°F | Wt 282.4 lb

## 2021-06-28 DIAGNOSIS — O99213 Obesity complicating pregnancy, third trimester: Secondary | ICD-10-CM

## 2021-06-28 DIAGNOSIS — O99210 Obesity complicating pregnancy, unspecified trimester: Secondary | ICD-10-CM

## 2021-06-28 DIAGNOSIS — O24410 Gestational diabetes mellitus in pregnancy, diet controlled: Secondary | ICD-10-CM

## 2021-06-28 DIAGNOSIS — O0993 Supervision of high risk pregnancy, unspecified, third trimester: Secondary | ICD-10-CM

## 2021-06-28 LAB — URINALYSIS
Bilirubin, UA: NEGATIVE
Glucose, UA: NEGATIVE
Ketones, UA: NEGATIVE
Nitrite, UA: NEGATIVE
Protein,UA: NEGATIVE
RBC, UA: NEGATIVE
Specific Gravity, UA: 1.02 (ref 1.005–1.030)
Urobilinogen, Ur: 1 mg/dL (ref 0.2–1.0)
pH, UA: 7 (ref 5.0–7.5)

## 2021-06-28 NOTE — Progress Notes (Signed)
   PRENATAL VISIT NOTE  Subjective:  Kirsten Hurley is a 30 y.o. G2P0010 at [redacted]w[redacted]d being seen today for ongoing prenatal care.  She is currently monitored for the following issues for this high-risk pregnancy and has Supervision of high risk pregnancy, antepartum; Rh negative state in antepartum period; Abnormal glucose tolerance test; Obesity during pregnancy, antepartum BMI=45.5; Anemia affecting pregnancy; and Gestational diabetes mellitus 05/03/21 on their problem list.  Patient reports no complaints.  Contractions: Not present. Vag. Bleeding: None.  Movement: Present. Denies leaking of fluid/ROM.   The following portions of the patient's history were reviewed and updated as appropriate: allergies, current medications, past family history, past medical history, past social history, past surgical history and problem list. Problem list updated.  Objective:   Vitals:   06/28/21 1407  BP: 119/78  Pulse: 93  Temp: 97.9 F (36.6 C)  Weight: 282 lb 6.4 oz (128.1 kg)    Fetal Status: Fetal Heart Rate (bpm): 142 Fundal Height: 39 cm Movement: Present     General:  Alert, oriented and cooperative. Patient is in no acute distress.  Skin: Skin is warm and dry. No rash noted.   Cardiovascular: Normal heart rate noted  Respiratory: Normal respiratory effort, no problems with respiration noted  Abdomen: Soft, gravid, appropriate for gestational age.  Pain/Pressure: Absent     Pelvic: Cervical exam deferred        Extremities: Normal range of motion.  Edema: Trace  Mental Status: Normal mood and affect. Normal behavior. Normal judgment and thought content.   Assessment and Plan:  Pregnancy: G2P0010 at [redacted]w[redacted]d  1. Diet controlled gestational diabetes mellitus (GDM) in third trimester -Blood sugar log values below, good  control. Encouraged continued daily exercise and diet modifications.  0 of 18 FBS values are abnormal, range 86-94 1 of 52 2hr pp values are abnormal, range 79-120 Exercise:  walking ~ 15 mins x 4 days per week Recent food: Breakfast = sausage biscuit with eggs and water            Lunch = hot pocket and water            Dinner =hot dogs ([place]) and small portion of [place] fries  and water            Snack = SF jello  Water: 5-6  16.9 oz bottles  -Urine results  + 3 leuks.  -Ultrasound appointment on 07/07/21. Pt aware.  -Will need IOL at 40 wks, plan to complete paperwork at 39 wks.   - Urinalysis (Urine Dip)  2. Supervision of high risk pregnancy in third trimester  - Chlamydia/GC NAA, Confirmation - Culture, beta strep (group b only)  3. Obesity during pregnancy, antepartum BMI=45.5 Stopped ASA on 7/24 @ 36 wks    Preterm labor symptoms and general obstetric precautions including but not limited to vaginal bleeding, contractions, leaking of fluid and fetal movement were reviewed in detail with the patient. Please refer to After Visit Summary for other counseling recommendations.  Return in about 1 week (around 07/05/2021) for routine prenatal care.  Future Appointments  Date Time Provider Department Center  07/07/2021  7:30 AM [facility]    Wendi Snipes, FNP

## 2021-06-28 NOTE — Progress Notes (Signed)
Correctly verbalizes how to take [facility] and ir6/on tablet (takes each with a sip of juice). Blood sugar log copied and in outguide for provider review. Urine dip today. Last hgb on 05/31/2021 = 11.7. Last took Aspirin on 06/26/2021. Aware of [facility] [place] on 07/07/2021. Declined self-collection of 36 week cultures. Jossie Ng, RN Urine dip reviewed by Ruta Hinds FNP-BC. Jossie Ng, RN

## 2021-07-01 ENCOUNTER — Other Ambulatory Visit: Payer: Self-pay | Admitting: Obstetrics

## 2021-07-01 DIAGNOSIS — O24410 Gestational diabetes mellitus in pregnancy, diet controlled: Secondary | ICD-10-CM

## 2021-07-01 DIAGNOSIS — O99019 Anemia complicating pregnancy, unspecified trimester: Secondary | ICD-10-CM

## 2021-07-01 DIAGNOSIS — O99213 Obesity complicating pregnancy, third trimester: Secondary | ICD-10-CM

## 2021-07-01 DIAGNOSIS — Z3A37 37 weeks gestation of pregnancy: Secondary | ICD-10-CM

## 2021-07-01 DIAGNOSIS — O0993 Supervision of high risk pregnancy, unspecified, third trimester: Secondary | ICD-10-CM

## 2021-07-01 LAB — CHLAMYDIA/GC NAA, CONFIRMATION
Chlamydia trachomatis, NAA: NEGATIVE
Neisseria gonorrhoeae, NAA: NEGATIVE

## 2021-07-02 LAB — CULTURE, BETA STREP (GROUP B ONLY): Strep Gp B Culture: NEGATIVE

## 2021-07-05 ENCOUNTER — Ambulatory Visit: Payer: Medicaid Other | Admitting: Family Medicine

## 2021-07-05 ENCOUNTER — Other Ambulatory Visit: Payer: Self-pay

## 2021-07-05 VITALS — BP 133/75 | HR 92 | Temp 97.1°F | Wt 285.2 lb

## 2021-07-05 DIAGNOSIS — O0993 Supervision of high risk pregnancy, unspecified, third trimester: Secondary | ICD-10-CM

## 2021-07-05 DIAGNOSIS — O0990 Supervision of high risk pregnancy, unspecified, unspecified trimester: Secondary | ICD-10-CM

## 2021-07-05 DIAGNOSIS — O24419 Gestational diabetes mellitus in pregnancy, unspecified control: Secondary | ICD-10-CM

## 2021-07-05 LAB — URINALYSIS
Bilirubin, UA: NEGATIVE
Glucose, UA: NEGATIVE
Ketones, UA: NEGATIVE
Nitrite, UA: NEGATIVE
Protein,UA: NEGATIVE
RBC, UA: NEGATIVE
Specific Gravity, UA: 1.025 (ref 1.005–1.030)
Urobilinogen, Ur: 0.2 mg/dL (ref 0.2–1.0)
pH, UA: 7 (ref 5.0–7.5)

## 2021-07-05 NOTE — Progress Notes (Signed)
Here today for 37.1 week MH RV. Taking PNV and Iron QD. Denies ED/hospital visits since last RV. UA today. States needs "more test strips." Tawny Hopping, RN

## 2021-07-05 NOTE — Progress Notes (Signed)
   PRENATAL VISIT NOTE  Subjective:  Kirsten Hurley is a 30 y.o. G2P0010 at [redacted]w[redacted]d being seen today for ongoing prenatal care.  She is currently monitored for the following issues for this high-risk pregnancy and has Supervision of high risk pregnancy, antepartum; Rh negative state in antepartum period; Abnormal glucose tolerance test; Obesity during pregnancy, antepartum BMI=45.5; Anemia affecting pregnancy; and Gestational diabetes mellitus 05/03/21 on their problem list.  Patient reports no complaints.  Contractions: Not present. Vag. Bleeding: None.  Movement: Present. Denies leaking of fluid/ROM.   The following portions of the patient's history were reviewed and updated as appropriate: allergies, current medications, past family history, past medical history, past social history, past surgical history and problem list. Problem list updated.  Objective:   Vitals:   07/05/21 1354  BP: 133/75  Pulse: 92  Temp: (!) 97.1 F (36.2 C)  Weight: 285 lb 3.2 oz (129.4 kg)    Fetal Status: Fetal Heart Rate (bpm): 139 Fundal Height: 41 cm Movement: Present     General:  Alert, oriented and cooperative. Patient is in no acute distress.  Skin: Skin is warm and dry. No rash noted.   Cardiovascular: Normal heart rate noted  Respiratory: Normal respiratory effort, no problems with respiration noted  Abdomen: Soft, gravid, appropriate for gestational age.  Pain/Pressure: Absent     Pelvic: Cervical exam deferred        Extremities: Normal range of motion.  Edema: Trace  Mental Status: Normal mood and affect. Normal behavior. Normal judgment and thought content.   Assessment and Plan:  Pregnancy: G2P0010 at [redacted]w[redacted]d  1. Supervision of high risk pregnancy, antepartum Discussed baby ready  Taking PNV as directed  Reviewed 36 week labs MFM appt on 07/07/21  - Urinalysis (Urine Dip)  2. Gestational diabetes mellitus (GDM) affecting pregnancy -Blood sugar log values below, good  control. Encouraged  continued daily exercise and diet modifications.  0 of 8 FBS values are abnormal, range 83-92 2 of 21 2hr pp values are abnormal, range 81-125 Exercise: walking  Recent food: Breakfast = bagel, <teaspoon of cream cheese, 2 egg omelet            Lunch = Pizza rolls ( 10 )            Dinner =roast, green beans and 1/2 cup of rice            Snack = rice pudding, SF jello - strawberry  Water: 4-5 (16.9 oz bottle  -Urine results +1 leu.  -Ultrasound on 07/07/21. -Will reviewed when to do IOL after 8/4 U/S appt .       Term labor symptoms and general obstetric precautions including but not limited to vaginal bleeding, contractions, leaking of fluid and fetal movement were reviewed in detail with the patient. Please refer to After Visit Summary for other counseling recommendations.  Return in about 1 week (around 07/12/2021) for routine prenatal care.  Future Appointments  Date Time Provider Department Center  07/07/2021  7:30 AM [facility]  07/12/2021  1:40 PM [facility] PROVIDER [facility] None    Wendi Snipes, FNP

## 2021-07-07 ENCOUNTER — Ambulatory Visit: Payer: Medicaid Other | Attending: Maternal & Fetal Medicine

## 2021-07-07 ENCOUNTER — Other Ambulatory Visit: Payer: Self-pay

## 2021-07-07 VITALS — BP 118/73 | HR 87 | Temp 97.8°F | Ht 66.0 in | Wt 284.0 lb

## 2021-07-07 DIAGNOSIS — O99213 Obesity complicating pregnancy, third trimester: Secondary | ICD-10-CM | POA: Diagnosis not present

## 2021-07-07 DIAGNOSIS — O0993 Supervision of high risk pregnancy, unspecified, third trimester: Secondary | ICD-10-CM | POA: Insufficient documentation

## 2021-07-07 DIAGNOSIS — O24410 Gestational diabetes mellitus in pregnancy, diet controlled: Secondary | ICD-10-CM | POA: Diagnosis not present

## 2021-07-07 DIAGNOSIS — O99013 Anemia complicating pregnancy, third trimester: Secondary | ICD-10-CM | POA: Diagnosis not present

## 2021-07-07 DIAGNOSIS — R7309 Other abnormal glucose: Secondary | ICD-10-CM

## 2021-07-07 DIAGNOSIS — Z362 Encounter for other antenatal screening follow-up: Secondary | ICD-10-CM | POA: Diagnosis not present

## 2021-07-07 DIAGNOSIS — Z3A37 37 weeks gestation of pregnancy: Secondary | ICD-10-CM | POA: Insufficient documentation

## 2021-07-07 DIAGNOSIS — Z6791 Unspecified blood type, Rh negative: Secondary | ICD-10-CM

## 2021-07-07 DIAGNOSIS — O26899 Other specified pregnancy related conditions, unspecified trimester: Secondary | ICD-10-CM

## 2021-07-07 DIAGNOSIS — O99019 Anemia complicating pregnancy, unspecified trimester: Secondary | ICD-10-CM

## 2021-07-07 DIAGNOSIS — O0990 Supervision of high risk pregnancy, unspecified, unspecified trimester: Secondary | ICD-10-CM

## 2021-07-07 DIAGNOSIS — O99210 Obesity complicating pregnancy, unspecified trimester: Secondary | ICD-10-CM

## 2021-07-12 ENCOUNTER — Ambulatory Visit: Payer: Medicaid Other | Admitting: Family Medicine

## 2021-07-12 ENCOUNTER — Other Ambulatory Visit: Payer: Self-pay

## 2021-07-12 VITALS — BP 114/78 | HR 86 | Temp 97.5°F | Wt 285.8 lb

## 2021-07-12 DIAGNOSIS — O99012 Anemia complicating pregnancy, second trimester: Secondary | ICD-10-CM

## 2021-07-12 DIAGNOSIS — O99013 Anemia complicating pregnancy, third trimester: Secondary | ICD-10-CM

## 2021-07-12 DIAGNOSIS — O0993 Supervision of high risk pregnancy, unspecified, third trimester: Secondary | ICD-10-CM

## 2021-07-12 DIAGNOSIS — O24419 Gestational diabetes mellitus in pregnancy, unspecified control: Secondary | ICD-10-CM

## 2021-07-12 LAB — URINALYSIS
Bilirubin, UA: NEGATIVE
Glucose, UA: NEGATIVE
Ketones, UA: NEGATIVE
Nitrite, UA: NEGATIVE
Protein,UA: NEGATIVE
RBC, UA: NEGATIVE
Specific Gravity, UA: 1.015 (ref 1.005–1.030)
Urobilinogen, Ur: 1 mg/dL (ref 0.2–1.0)
pH, UA: 6.5 (ref 5.0–7.5)

## 2021-07-12 LAB — HEMOGLOBIN, FINGERSTICK: Hemoglobin: 12.9 g/dL (ref 11.1–15.9)

## 2021-07-15 NOTE — Progress Notes (Signed)
[facility] IOL referral faxed with patient records and snapshot. Confirmation received.Burt Knack, RN

## 2021-07-15 NOTE — Progress Notes (Signed)
   PRENATAL VISIT NOTE  Subjective:  Kirsten Hurley is a 30 y.o. G2P0010 at [redacted]w[redacted]d being seen today for ongoing prenatal care.  She is currently monitored for the following issues for this high-risk pregnancy and has Supervision of high risk pregnancy, antepartum; Rh negative state in antepartum period; Abnormal glucose tolerance test; Obesity during pregnancy, antepartum BMI=45.5; Anemia affecting pregnancy; and Gestational diabetes mellitus 05/03/21 on their problem list.  Patient reports no complaints.  Contractions: Not present. Vag. Bleeding: None.  Movement: Present. Denies leaking of fluid/ROM.   The following portions of the patient's history were reviewed and updated as appropriate: allergies, current medications, past family history, past medical history, past social history, past surgical history and problem list. Problem list updated.  Objective:   Vitals:   07/12/21 1408  BP: 114/78  Pulse: 86  Temp: (!) 97.5 F (36.4 C)  Weight: 285 lb 12.8 oz (129.6 kg)    Fetal Status: Fetal Heart Rate (bpm): 135 Fundal Height: 43 cm Movement: Present  Presentation: Vertex  General:  Alert, oriented and cooperative. Patient is in no acute distress.  Skin: Skin is warm and dry. No rash noted.   Cardiovascular: Normal heart rate noted  Respiratory: Normal respiratory effort, no problems with respiration noted  Abdomen: Soft, gravid, appropriate for gestational age.  Pain/Pressure: Absent     Pelvic: Cervical exam performed Dilation: 1.5 Effacement (%): Thick Station: Ballotable  Extremities: Normal range of motion.  Edema: Mild pitting, slight indentation  Mental Status: Normal mood and affect. Normal behavior. Normal judgment and thought content.   Assessment and Plan:  Pregnancy: G2P0010 at [redacted]w[redacted]d  1. Gestational diabetes mellitus (GDM) affecting pregnancy  -Blood sugar log values below, good control. Encouraged continued daily exercise and diet modifications.  0 of 7 FBS values are  abnormal, range 73-97 1 of 19 2hr pp values are abnormal, range 82-120 Exercise: walking  Recent food: Breakfast = sausage omelet            Lunch = pizza bites            Dinner =pork chops, mash potatoes and green beans.            Snack = sugar free jello Water: ~5-6 bottles  -Urine results trace leuks.  -Ultrasound  last on 8/4. EFW 75 % 7lb 8 oz. Good fetal movement and amniotic fluid volume -Will need IOL at 40 wks, complete paperwork at 38 wks.   Baby ready-  patient has car seat seat installed, bag packed and sleeping arrangements set up for baby.    - Urinalysis (Urine Dip) - Hemoglobin, fingerstick  2. Anemia affecting pregnancy in second trimester Taking iron as directed. Hgb today was 12.9   Term labor symptoms and general obstetric precautions including but not limited to vaginal bleeding, contractions, leaking of fluid and fetal movement were reviewed in detail with the patient. Please refer to After Visit Summary for other counseling recommendations.  Return in about 1 week (around 07/19/2021) for routine prenatal care.  Future Appointments  Date Time Provider Department Center  07/19/2021  1:40 PM [facility] PROVIDER [facility] None    Wendi Snipes, FNP

## 2021-07-17 DIAGNOSIS — O24419 Gestational diabetes mellitus in pregnancy, unspecified control: Secondary | ICD-10-CM | POA: Insufficient documentation

## 2021-07-17 DIAGNOSIS — O99013 Anemia complicating pregnancy, third trimester: Secondary | ICD-10-CM | POA: Insufficient documentation

## 2021-07-17 DIAGNOSIS — Z87891 Personal history of nicotine dependence: Secondary | ICD-10-CM | POA: Insufficient documentation

## 2021-07-17 DIAGNOSIS — Z3A39 39 weeks gestation of pregnancy: Secondary | ICD-10-CM | POA: Insufficient documentation

## 2021-07-17 DIAGNOSIS — D649 Anemia, unspecified: Secondary | ICD-10-CM | POA: Insufficient documentation

## 2021-07-17 DIAGNOSIS — O471 False labor at or after 37 completed weeks of gestation: Principal | ICD-10-CM | POA: Insufficient documentation

## 2021-07-18 ENCOUNTER — Other Ambulatory Visit: Payer: Self-pay

## 2021-07-18 ENCOUNTER — Observation Stay
Admission: EM | Admit: 2021-07-18 | Discharge: 2021-07-18 | Disposition: A | Discharge status: Home / Self Care | Payer: Medicaid Other

## 2021-07-18 ENCOUNTER — Inpatient Hospital Stay
Admission: EM | Admit: 2021-07-18 | Discharge: 2021-07-21 | DRG: 807 | Disposition: A | Discharge status: Home / Self Care | Payer: Medicaid Other | Attending: Obstetrics | Admitting: Obstetrics

## 2021-07-18 ENCOUNTER — Encounter: Payer: Self-pay | Admitting: Obstetrics and Gynecology

## 2021-07-18 DIAGNOSIS — O99210 Obesity complicating pregnancy, unspecified trimester: Secondary | ICD-10-CM

## 2021-07-18 DIAGNOSIS — O26899 Other specified pregnancy related conditions, unspecified trimester: Principal | ICD-10-CM

## 2021-07-18 DIAGNOSIS — Z6791 Unspecified blood type, Rh negative: Principal | ICD-10-CM

## 2021-07-18 DIAGNOSIS — O24420 Gestational diabetes mellitus in childbirth, diet controlled: Principal | ICD-10-CM | POA: Diagnosis present

## 2021-07-18 DIAGNOSIS — R7309 Other abnormal glucose: Secondary | ICD-10-CM

## 2021-07-18 DIAGNOSIS — O9902 Anemia complicating childbirth: Secondary | ICD-10-CM | POA: Diagnosis present

## 2021-07-18 DIAGNOSIS — O0990 Supervision of high risk pregnancy, unspecified, unspecified trimester: Secondary | ICD-10-CM

## 2021-07-18 DIAGNOSIS — Z87891 Personal history of nicotine dependence: Secondary | ICD-10-CM

## 2021-07-18 DIAGNOSIS — Z20822 Contact with and (suspected) exposure to covid-19: Secondary | ICD-10-CM | POA: Diagnosis present

## 2021-07-18 DIAGNOSIS — Z3A39 39 weeks gestation of pregnancy: Secondary | ICD-10-CM

## 2021-07-18 DIAGNOSIS — O26893 Other specified pregnancy related conditions, third trimester: Secondary | ICD-10-CM | POA: Diagnosis present

## 2021-07-18 DIAGNOSIS — O99214 Obesity complicating childbirth: Secondary | ICD-10-CM | POA: Diagnosis present

## 2021-07-18 DIAGNOSIS — O479 False labor, unspecified: Secondary | ICD-10-CM | POA: Diagnosis present

## 2021-07-18 LAB — RUPTURE OF MEMBRANE (ROM)PLUS: Rom Plus: NEGATIVE

## 2021-07-18 MED ORDER — CALCIUM CARBONATE ANTACID 500 MG PO CHEW: 2.0000 | CHEWABLE_TABLET | ORAL | Status: DC | PRN

## 2021-07-18 MED ORDER — ACETAMINOPHEN 500 MG PO TABS: 1000.0000 mg | ORAL_TABLET | Freq: Four times a day (QID) | ORAL | Status: DC | PRN

## 2021-07-18 MED ORDER — OXYTOCIN-SODIUM CHLORIDE 30-0.9 UT/500ML-% IV SOLN
INTRAVENOUS | Status: AC
  Filled 2021-07-18: qty 500

## 2021-07-18 MED ORDER — AMMONIA AROMATIC IN INHA
RESPIRATORY_TRACT | Status: AC
  Filled 2021-07-18: qty 10

## 2021-07-18 MED ORDER — MISOPROSTOL 200 MCG PO TABS
ORAL_TABLET | ORAL | Status: AC
  Filled 2021-07-18: qty 4

## 2021-07-18 MED ORDER — OXYTOCIN 10 UNIT/ML IJ SOLN
INTRAMUSCULAR | Status: AC
  Filled 2021-07-18: qty 2

## 2021-07-18 MED ORDER — LIDOCAINE HCL (PF) 1 % IJ SOLN
INTRAMUSCULAR | Status: AC
  Filled 2021-07-18: qty 30

## 2021-07-18 NOTE — Discharge Summary (Signed)
Kirsten Hurley is Kirsten 30 y.o. female. She is at 45w0dgestation. Patient's last menstrual period was 10/18/2020 (approximate). Estimated Date of Delivery: 07/25/21  Prenatal care site: [facility]  Chief complaint: contractions   HPI: Kirsten Hurley to L&D with complaints of contractions that have gotten progressively worse overnight.  Unsure if she's had any LOF.  Denies vaginal bleeding.  Endorses good fetal movement.    S: Resting comfortably. no CTX, no VB.no LOF,  Active fetal movement.   Maternal Medical History:  Past Medical Hx:  has Kirsten past medical history of Anemia affecting pregnancy (05/03/2021), Gestational diabetes, History of spontaneous abortion (12/2019), and History of urinary tract infection.    Past Surgical Hx:  has Kirsten past surgical history that includes Denies surgical history.   No Known Allergies   Prior to Admission medications   Medication Sig Start Date End Date Taking? Authorizing Provider  Accu-Chek Softclix Lancets lancets SMARTSIG:Topical 1-4 Times Daily 05/12/21  Yes [provider]  blood glucose meter kit and supplies Dispense based on patient and insurance preference. Use up to four times daily as directed. (FOR ICD-10 E10.9, E11.9). 05/12/21  Yes GJunious Dresser FNP  ferrous sulfate 325 (65 FE) MG tablet Take 1 tablet (325 mg total) by mouth daily with breakfast. 05/03/21  Yes GJunious Dresser FNP  Glucose Blood (BLOOD GLUCOSE TEST STRIPS) STRP 1 Units by In Vitro route in the morning, at noon, in the evening, and at bedtime. 06/14/21  Yes Sciora, EReal Cons CNM  Prenatal Vit-Fe Fumarate-FA (PREPLUS) 27-1 MG TABS Take 1 tablet by mouth daily. 03/14/21  Yes GJunious Dresser FNP    Social History: She  reports that she quit smoking about 7 months ago. Her smoking use included cigarettes. She has Kirsten 8.00 pack-year smoking history. She has never used smokeless tobacco. She reports that she does not currently use alcohol. She reports that she does not currently use  drugs after having used the following drugs: Marijuana.  Family History: family history includes ADD / ADHD in her brother; CVA in her paternal grandmother; Diabetes in her paternal aunt and paternal grandfather; Hypertension in her paternal uncle.   Review of Systems: Kirsten full review of systems was performed and negative except as noted in the HPI.    O:  BP 130/85 (BP Location: Left Arm)   Pulse 99   Temp 98.2 F (36.8 C) (Oral)   Resp 16   LMP 10/18/2020 (Approximate)  Results for orders placed or performed during the hospital encounter of 07/18/21 (from the past 48 hour(s))  ROM Plus ([place])   Collection Time: 07/18/21 12:59 AM  Result Value Ref Range   Rom Plus NEGATIVE       Constitutional: NAD, AAOx3  HE/ENT: extraocular movements grossly intact, moist mucous membranes CV: RRR PULM: nl respiratory effort, CTABL Abd: gravid, non-tender, non-distended, soft  Ext: Non-tender, Nonedmeatous Psych: mood appropriate, speech normal SVE: Dilation: 3.5 Effacement (%): 90 Cervical Position: Middle Station: -2 Exam by:: Kirsten Hurley CNM  -Unchanged over 4 hours   Fetal Monitor: Baseline: 135 bpm Variability: moderate Accels: Present Decels: none Toco: regular, every 2-5 minutes  Category: I   Assessment: 30y.o. 324w0dere for antenatal surveillance during pregnancy.  Principle diagnosis: Early labor    Plan: Early labor  Fetal Wellbeing: Reassuring Cat 1 tracing. Reactive NST  Reviewed comfort measures for early labor  D/c home stable, precautions reviewed, follow-up as scheduled.   ----- Kirsten Hurley Certified Nurse Midwife Kirsten Hurley  [facility]

## 2021-07-18 NOTE — Progress Notes (Signed)
Discharge instructions provided to pt. Pt verbalizes understanding. Vaginal bleeding and discharge, contractions, and fetal movement reviewed by RN and CNM. Pt discharged home with significant other.

## 2021-07-18 NOTE — Progress Notes (Deleted)
Kirsten Hurley is a 30 y.o. female. She is at 45w0dgestation. Patient's last menstrual period was 10/18/2020 (approximate). Estimated Date of Delivery: 07/25/21  Prenatal care site: [facility]  Chief complaint: contractions   HPI: [name] to L&D with complaints of contractions that have gotten progressively worse overnight.  Unsure if she's had any LOF.  Denies vaginal bleeding.  Endorses good fetal movement.    S: Resting comfortably. no CTX, no VB.no LOF,  Active fetal movement.   Maternal Medical History:  Past Medical Hx:  has a past medical history of Anemia affecting pregnancy (05/03/2021), Gestational diabetes, History of spontaneous abortion (12/2019), and History of urinary tract infection.    Past Surgical Hx:  has a past surgical history that includes Denies surgical history.   No Known Allergies   Prior to Admission medications   Medication Sig Start Date End Date Taking? Authorizing Provider  Accu-Chek Softclix Lancets lancets SMARTSIG:Topical 1-4 Times Daily 05/12/21  Yes [provider]  blood glucose meter kit and supplies Dispense based on patient and insurance preference. Use up to four times daily as directed. (FOR ICD-10 E10.9, E11.9). 05/12/21  Yes GJunious Dresser FNP  ferrous sulfate 325 (65 FE) MG tablet Take 1 tablet (325 mg total) by mouth daily with breakfast. 05/03/21  Yes GJunious Dresser FNP  Glucose Blood (BLOOD GLUCOSE TEST STRIPS) STRP 1 Units by In Vitro route in the morning, at noon, in the evening, and at bedtime. 06/14/21  Yes Sciora, EReal Cons CNM  Prenatal Vit-Fe Fumarate-FA (PREPLUS) 27-1 MG TABS Take 1 tablet by mouth daily. 03/14/21  Yes GJunious Dresser FNP    Social History: She  reports that she quit smoking about 7 months ago. Her smoking use included cigarettes. She has a 8.00 pack-year smoking history. She has never used smokeless tobacco. She reports that she does not currently use alcohol. She reports that she does not currently use  drugs after having used the following drugs: Marijuana.  Family History: family history includes ADD / ADHD in her brother; CVA in her paternal grandmother; Diabetes in her paternal aunt and paternal grandfather; Hypertension in her paternal uncle.   Review of Systems: A full review of systems was performed and negative except as noted in the HPI.    O:  BP 130/85 (BP Location: Left Arm)   Pulse 99   Temp 98.2 F (36.8 C) (Oral)   Resp 16   LMP 10/18/2020 (Approximate)  Results for orders placed or performed during the hospital encounter of 07/18/21 (from the past 48 hour(s))  ROM Plus ([place])   Collection Time: 07/18/21 12:59 AM  Result Value Ref Range   Rom Plus NEGATIVE       Constitutional: NAD, AAOx3  HE/ENT: extraocular movements grossly intact, moist mucous membranes CV: RRR PULM: nl respiratory effort, CTABL Abd: gravid, non-tender, non-distended, soft  Ext: Non-tender, Nonedmeatous Psych: mood appropriate, speech normal SVE: Dilation: 3.5 Effacement (%): 90 Cervical Position: Middle Station: -2 Exam by:: A Carlesha Seiple CNM  -Unchanged over 4 hours   Fetal Monitor: Baseline: 135 bpm Variability: moderate Accels: Present Decels: none Toco: regular, every 2-5 minutes  Category: I   Assessment: 30y.o. 324w0dere for antenatal surveillance during pregnancy.  Principle diagnosis: Early labor    Plan: Early labor  Fetal Wellbeing: Reassuring Cat 1 tracing. Reactive NST  Reviewed comfort measures for early labor  D/c home stable, precautions reviewed, follow-up as scheduled.   ----- AnDrinda ButtsCNM Certified Nurse Midwife [name]  [facility]

## 2021-07-18 NOTE — OB Triage Note (Signed)
Patient is a 30 yo, G2P0, at 39 weeks 0 days. Patient presents with complaints of contractions every 5 minutes. Pt states "I felt like I peed myself this morning, but it didn't have a smell or color and about 4 hours after that I started contracting." Pt denies any vaginal bleeding and is unsure about ROM. Pt reports +FM. Monitors applied and assessing. VSS. Initial fetal heart tone. Will continue to monitor.

## 2021-07-18 NOTE — H&P (Signed)
OB History & Physical   History of Present Illness:   Chief Complaint: Contractions  HPI:  Kirsten Hurley is a 30 y.o. G2P0010 female at 41w0ddated by LMP.  She presents to L&D for active labor  Reports active fetal movement  Contractions: every 2 to 4 minutes LOF/SROM: Intact Vaginal bleeding: denies  Factors complicating pregnancy:   Patient Active Problem List   Diagnosis Date Noted   Uterine contractions during pregnancy 07/18/2021   Gestational diabetes mellitus 05/03/21 05/17/2021   Anemia affecting pregnancy 05/03/2021   Obesity during pregnancy, antepartum BMI=45.5 02/11/2021   Rh negative state in antepartum period 01/21/2021   Abnormal glucose tolerance test 01/21/2021   Supervision of high risk pregnancy, antepartum 01/17/2021     Maternal Medical History:   Past Medical History:  Diagnosis Date   Anemia affecting pregnancy 05/03/2021   Gestational diabetes    History of spontaneous abortion 12/2019   History of urinary tract infection    as young teenager    Past Surgical History:  Procedure Laterality Date   Denies surgical history      Not on File  Prior to Admission medications   Medication Sig Start Date End Date Taking? Authorizing Provider  Accu-Chek Softclix Lancets lancets SMARTSIG:Topical 1-4 Times Daily 05/12/21  Yes [provider]  blood glucose meter kit and supplies Dispense based on patient and insurance preference. Use up to four times daily as directed. (FOR ICD-10 E10.9, E11.9). 05/12/21  Yes GJunious Dresser FNP  ferrous sulfate 325 (65 FE) MG tablet Take 1 tablet (325 mg total) by mouth daily with breakfast. 05/03/21  Yes GJunious Dresser FNP  Glucose Blood (BLOOD GLUCOSE TEST STRIPS) STRP 1 Units by In Vitro route in the morning, at noon, in the evening, and at bedtime. 06/14/21  Yes Sciora, EReal Cons CNM  Prenatal Vit-Fe Fumarate-FA (PREPLUS) 27-1 MG TABS Take 1 tablet by mouth daily. 03/14/21  Yes GJunious Dresser FNP      Prenatal care site:  [facility]  Social History: She  reports that she quit smoking about 7 months ago. Her smoking use included cigarettes. She has a 8.00 pack-year smoking history. She has never used smokeless tobacco. She reports that she does not currently use alcohol. She reports that she does not currently use drugs after having used the following drugs: Marijuana.  Family History: family history includes ADD / ADHD in her brother; CVA in her paternal grandmother; Diabetes in her paternal aunt and paternal grandfather; Hypertension in her paternal uncle.   Review of Systems: A full review of systems was performed and negative except as noted in the HPI.     Physical Exam:  Vital Signs: BP (!) 141/79 (BP Location: Left Arm)   Pulse 96   Temp 98.5 F (36.9 C) (Oral)   Resp 16   Ht _0  (1.676 m)   Wt 129.6 kg   LMP 10/18/2020 (Approximate)   BMI 46.13 kg/m  Physical Exam  General: no acute distress.  HEENT: normocephalic, atraumatic Heart: regular rate & rhythm.  No murmurs/rubs/gallops Lungs: clear to auscultation bilaterally, normal respiratory effort Abdomen: soft, gravid, non-tender;  EFW: 3391gm (7.8lbs) Pelvic:   External: Normal external female genitalia  Cervix: Dilation: 9 / Effacement (%): 100 / Station: 0    Extremities: non-tender, symmetric, no edema bilaterally.   Neurologic: Alert & oriented x 3.    Results for orders placed or performed during the hospital encounter of 07/18/21 (from the past 24 hour(s))  ROM Plus (  [place] only)     Status: None   Collection Time: 07/18/21 12:59 AM  Result Value Ref Range   Rom Plus NEGATIVE     Pertinent Results:  Prenatal Labs: Blood type/Rh A-  Antibody screen neg  Rubella Immune  Varicella Immune  RPR NR  HBsAg Neg  HIV NR  GC neg  Chlamydia neg  Genetic screening cfDNA negative   1 hour GTT 135  3 hour GTT 76, 166,119,52  GBS neg   FHT:  FHR: 140 bpm, variability: moderate,  accelerations:  Present,   decelerations:  Absent Category/reactivity:  Category I UC:   regular, every 2-4 minutes   Cephalic by Leopolds and SVE   [place] MFM OB FOLLOW UP  Result Date: 07/08/2021 ----------------------------------------------------------------------  OBSTETRICS REPORT                        (Signed Final 07/08/2021 05:45 am) ---------------------------------------------------------------------- Patient Info  ID #:       [id number]                          D.O.B.:  [birth year] (30 yrs)  Name:       Kirsten Hurley                  Visit Date: 07/07/2021 07:53 am ---------------------------------------------------------------------- Performed By  Attending:        Sander Nephew      Ref. Address:      [address],                                                              [place], [place]  Performed By:     Elpidio Eric,           [facility]                    RDMS, RDCS                                [facility]  Referred By:      Herbie Saxon CNM ---------------------------------------------------------------------- Orders  #  Description                           Code        Ordered By  1  [place] MFM OB FOLLOW UP                   [phone]    YU FANG ----------------------------------------------------------------------  #  Order #                     Accession #                Episode #  1  [id number]                   [id number]                 [id number] ---------------------------------------------------------------------- Indications  Gestational diabetes in pregnancy, diet         O24.410  controlled  Obesity complicating pregnancy, third           O99.213  trimester  Uterine size-date discrepancy, third trimester  O26.843  [redacted]  weeks gestation of pregnancy                 Z3A.37 ---------------------------------------------------------------------- Fetal Evaluation  Num Of Fetuses:          1  Fetal Heart Rate(bpm):   133  Cardiac Activity:        Observed  Presentation:            Cephalic  Placenta:                Posterior  Amniotic Fluid  AFI FV:      Within normal limits  AFI Sum(cm)     %Tile       Largest Pocket(cm)  13.9            52          4.2  RUQ(cm)       RLQ(cm)       LUQ(cm)        LLQ(cm)  2._0 4.2 ---------------------------------------------------------------------- Biometry  BPD:        91  mm     G. Age:  36w 6d         54  %    CI:        73.98   %    70 - 86                                                          FL/HC:       20.8  %    20.8 - 22.6  HC:        336  mm     G. Age:  62w 3d  52  %    HC/AC:       0.95       0.92 - 1.05  AC:      352.6  mm     G. Age:  39w 1d         96  %    FL/BPD:      76.8  %    71 - 87  FL:       69.9  mm     G. Age:  35w 6d         14  %    FL/AC:       19.8  %    20 - 24  HUM:      59.6  mm     G. Age:  34w 4d         18  %  Est. FW:    3391   gm     7 lb 8 oz     75  % ---------------------------------------------------------------------- OB History  Gravidity:    2         Term:   0        Prem:   0        SAB:   1  TOP:          0       Ectopic:  0        Living: 0 ---------------------------------------------------------------------- Gestational Age  LMP:           37w 3d        Date:  10/18/20                 EDD:   07/25/21  U/S Today:     37w 4d                                        EDD:   07/24/21  Best:          37w 3d     Det. By:  LMP  (10/18/20)          EDD:   07/25/21 ---------------------------------------------------------------------- Anatomy  Cranium:               Appears normal         Aortic Arch:            Previously seen  Cavum:                 Appears normal         Ductal Arch:            Previously seen   Ventricles:            Previously seen        Diaphragm:              Previously seen  Choroid Plexus:        Previously seen        Stomach:                Appears normal, left  sided  Cerebellum:            Previously seen        Abdomen:                Appears normal  Posterior Fossa:       Previously seen        Abdominal Wall:         Previously seen  Nuchal Fold:           Previously seen        Cord Vessels:           Previously seen  Face:                  Orbits and profile     Kidneys:                Appear normal                         previously seen  Lips:                  Previously seen        Bladder:                Appears normal  Heart:                 Previously seen        Spine:                  Previously seen  RVOT:                  Previously seen        Upper Extremities:      Previously seen  LVOT:                  Previously seen        Lower Extremities:      Previously seen ---------------------------------------------------------------------- Impression  Follow up growth due to A1GDM  Normal interval growth with measurements consistent with  dates  Good fetal movement and amniotic fluid volume ---------------------------------------------------------------------- Recommendations  Follow up as clinically indicated. ----------------------------------------------------------------------               Sander Nephew, MD Electronically Signed Final Report   07/08/2021 05:45 am ----------------------------------------------------------------------   Assessment:  KATELY GRAFFAM is a 30 y.o. G2P0010 female at 54w0dpresents in active labor.   Plan:  1. Admit to Labor & Delivery; consents reviewed and obtained - Covid admission screen   2. Fetal Well being  - Fetal Tracing: Category 1 - Group B Streptococcus ppx not indicated: GBS neg - Presentation: cephalic confirmed by SVE   3. Routine OB: - Prenatal labs  reviewed, as above - Rh neg - CBC, T&S, RPR on admit - Clear fluids, IVF   4. Monitoring of labor  - Contractions monitored with external toco - Plan for expectant management  - Augmentation with AROM as appropriate  - Plan for  continuous fetal monitoring - Maternal pain control as desired; planning regional anesthesia - Anticipate vaginal delivery  5. Post Partum Planning: - Infant feeding: Breast/Bottle - Contraception: nexplanon - Tdap vaccine: given - Flu vaccine: given  Nygeria Lager LMarlaine Hind CNM 072/[id number]/66144:03PM  Certified Nurse Midwife [facility]

## 2021-07-19 ENCOUNTER — Encounter: Payer: Self-pay | Admitting: Obstetrics and Gynecology

## 2021-07-19 ENCOUNTER — Ambulatory Visit: Payer: Medicaid Other

## 2021-07-19 ENCOUNTER — Inpatient Hospital Stay: Payer: Medicaid Other | Admitting: Anesthesiology

## 2021-07-19 DIAGNOSIS — O24420 Gestational diabetes mellitus in childbirth, diet controlled: Secondary | ICD-10-CM | POA: Diagnosis present

## 2021-07-19 DIAGNOSIS — Z3A39 39 weeks gestation of pregnancy: Secondary | ICD-10-CM | POA: Diagnosis not present

## 2021-07-19 DIAGNOSIS — O0993 Supervision of high risk pregnancy, unspecified, third trimester: Secondary | ICD-10-CM

## 2021-07-19 DIAGNOSIS — Z20822 Contact with and (suspected) exposure to covid-19: Secondary | ICD-10-CM | POA: Diagnosis present

## 2021-07-19 DIAGNOSIS — O9902 Anemia complicating childbirth: Secondary | ICD-10-CM | POA: Diagnosis present

## 2021-07-19 DIAGNOSIS — O99214 Obesity complicating childbirth: Secondary | ICD-10-CM | POA: Diagnosis present

## 2021-07-19 DIAGNOSIS — Z87891 Personal history of nicotine dependence: Secondary | ICD-10-CM | POA: Diagnosis not present

## 2021-07-19 DIAGNOSIS — Z6791 Unspecified blood type, Rh negative: Secondary | ICD-10-CM | POA: Diagnosis not present

## 2021-07-19 DIAGNOSIS — O26893 Other specified pregnancy related conditions, third trimester: Secondary | ICD-10-CM | POA: Diagnosis present

## 2021-07-19 LAB — CBC
HCT: 34.9 % — ABNORMAL LOW (ref 36.0–46.0)
HCT: 37.2 % (ref 36.0–46.0)
Hemoglobin: 11.9 g/dL — ABNORMAL LOW (ref 12.0–15.0)
Hemoglobin: 13.1 g/dL (ref 12.0–15.0)
MCH: 31.1 pg (ref 26.0–34.0)
MCH: 32.4 pg (ref 26.0–34.0)
MCHC: 34.1 g/dL (ref 30.0–36.0)
MCHC: 35.2 g/dL (ref 30.0–36.0)
MCV: 91.1 fL (ref 80.0–100.0)
MCV: 92.1 fL (ref 80.0–100.0)
Platelets: 202 10*3/uL (ref 150–400)
Platelets: 222 10*3/uL (ref 150–400)
RBC: 3.83 MIL/uL — ABNORMAL LOW (ref 3.87–5.11)
RBC: 4.04 MIL/uL (ref 3.87–5.11)
RDW: 14 % (ref 11.5–15.5)
RDW: 14.3 % (ref 11.5–15.5)
WBC: 20.7 10*3/uL — ABNORMAL HIGH (ref 4.0–10.5)
WBC: 22.7 10*3/uL — ABNORMAL HIGH (ref 4.0–10.5)
nRBC: 0 % (ref 0.0–0.2)
nRBC: 0 % (ref 0.0–0.2)

## 2021-07-19 LAB — RESP PANEL BY RT-PCR (FLU A&B, COVID) ARPGX2
Influenza A by PCR: NEGATIVE
Influenza B by PCR: NEGATIVE
SARS Coronavirus 2 by RT PCR: NEGATIVE

## 2021-07-19 LAB — GLUCOSE, CAPILLARY: Glucose-Capillary: 86 mg/dL (ref 70–99)

## 2021-07-19 MED ORDER — OXYTOCIN-SODIUM CHLORIDE 30-0.9 UT/500ML-% IV SOLN
1.0000 m[IU]/min | INTRAVENOUS | Status: DC
  Administered 2021-07-19: 2 m[IU]/min via INTRAVENOUS

## 2021-07-19 MED ORDER — FLEET ENEMA 7-19 GM/118ML RE ENEM: 1.0000 | ENEMA | Freq: Every day | RECTAL | Status: DC | PRN

## 2021-07-19 MED ORDER — ONDANSETRON HCL 4 MG/2ML IJ SOLN: 4.0000 mg | Freq: Four times a day (QID) | INTRAMUSCULAR | Status: DC | PRN

## 2021-07-19 MED ORDER — LACTATED RINGERS IV SOLN
500.0000 mL | Freq: Once | INTRAVENOUS | Status: AC
  Administered 2021-07-19: 500 mL via INTRAVENOUS

## 2021-07-19 MED ORDER — LACTATED RINGERS IV SOLN: 500.0000 mL | INTRAVENOUS | Status: DC | PRN

## 2021-07-19 MED ORDER — DIPHENHYDRAMINE HCL 25 MG PO CAPS: 25.0000 mg | ORAL_CAPSULE | Freq: Four times a day (QID) | ORAL | Status: DC | PRN

## 2021-07-19 MED ORDER — LACTATED RINGERS IV SOLN
INTRAVENOUS | Status: DC
Start: 2021-07-19 — End: 2021-07-19

## 2021-07-19 MED ORDER — OXYTOCIN-SODIUM CHLORIDE 30-0.9 UT/500ML-% IV SOLN
2.5000 [IU]/h | INTRAVENOUS | Status: DC
  Administered 2021-07-19: 2.5 [IU]/h via INTRAVENOUS
  Filled 2021-07-19: qty 500

## 2021-07-19 MED ORDER — ONDANSETRON HCL 4 MG/2ML IJ SOLN: 4.0000 mg | INTRAMUSCULAR | Status: DC | PRN

## 2021-07-19 MED ORDER — FENTANYL-BUPIVACAINE-NACL 0.5-0.125-0.9 MG/250ML-% EP SOLN
12.0000 mL/h | EPIDURAL | Status: DC | PRN
  Administered 2021-07-19: 12 mL/h via EPIDURAL
  Administered 2021-07-19: 10 mL/h via EPIDURAL

## 2021-07-19 MED ORDER — OXYTOCIN BOLUS FROM INFUSION
333.0000 mL | Freq: Once | INTRAVENOUS | Status: AC
Start: 2021-07-19 — End: 2021-07-19
  Administered 2021-07-19: 333 mL via INTRAVENOUS

## 2021-07-19 MED ORDER — EPHEDRINE 5 MG/ML INJ
10.0000 mg | INTRAVENOUS | Status: DC | PRN
Start: 2021-07-19 — End: 2021-07-19

## 2021-07-19 MED ORDER — WITCH HAZEL-GLYCERIN EX PADS: 1.0000 "application " | MEDICATED_PAD | CUTANEOUS | Status: DC | PRN

## 2021-07-19 MED ORDER — DIPHENHYDRAMINE HCL 50 MG/ML IJ SOLN
12.5000 mg | INTRAMUSCULAR | Status: DC | PRN
Start: 2021-07-19 — End: 2021-07-19

## 2021-07-19 MED ORDER — SOD CITRATE-CITRIC ACID 500-334 MG/5ML PO SOLN: 30.0000 mL | ORAL | Status: DC | PRN

## 2021-07-19 MED ORDER — LIDOCAINE-EPINEPHRINE (PF) 1.5 %-1:200000 IJ SOLN
INTRAMUSCULAR | Status: DC | PRN
  Administered 2021-07-19: 4 mL via EPIDURAL

## 2021-07-19 MED ORDER — EPHEDRINE 5 MG/ML INJ: 10.0000 mg | INTRAVENOUS | Status: DC | PRN

## 2021-07-19 MED ORDER — LIDOCAINE HCL (PF) 1 % IJ SOLN
INTRAMUSCULAR | Status: DC | PRN
  Administered 2021-07-19: 3 mL via INTRADERMAL

## 2021-07-19 MED ORDER — SODIUM CHLORIDE 0.9% FLUSH: 3.0000 mL | INTRAVENOUS | Status: DC | PRN

## 2021-07-19 MED ORDER — ACETAMINOPHEN 325 MG PO TABS
650.0000 mg | ORAL_TABLET | ORAL | Status: DC | PRN
Start: 2021-07-19 — End: 2021-07-19

## 2021-07-19 MED ORDER — SODIUM CHLORIDE 0.9 % IV SOLN: 250.0000 mL | INTRAVENOUS | Status: DC | PRN

## 2021-07-19 MED ORDER — IBUPROFEN 600 MG PO TABS
600.0000 mg | ORAL_TABLET | Freq: Four times a day (QID) | ORAL | Status: DC
Start: 2021-07-19 — End: 2021-07-21
  Administered 2021-07-19 – 2021-07-21 (×8): 600 mg via ORAL
  Filled 2021-07-19 (×9): qty 1

## 2021-07-19 MED ORDER — ONDANSETRON HCL 4 MG PO TABS
4.0000 mg | ORAL_TABLET | ORAL | Status: DC | PRN
  Filled 2021-07-19: qty 1

## 2021-07-19 MED ORDER — SODIUM CHLORIDE 0.9% FLUSH: 3.0000 mL | Freq: Two times a day (BID) | INTRAVENOUS | Status: DC

## 2021-07-19 MED ORDER — SIMETHICONE 80 MG PO CHEW: 80.0000 mg | CHEWABLE_TABLET | ORAL | Status: DC | PRN

## 2021-07-19 MED ORDER — TERBUTALINE SULFATE 1 MG/ML IJ SOLN: 0.2500 mg | Freq: Once | INTRAMUSCULAR | Status: DC | PRN

## 2021-07-19 MED ORDER — BUTORPHANOL TARTRATE 1 MG/ML IJ SOLN: 1.0000 mg | INTRAMUSCULAR | Status: DC | PRN

## 2021-07-19 MED ORDER — BISACODYL 10 MG RE SUPP: 10.0000 mg | Freq: Every day | RECTAL | Status: DC | PRN

## 2021-07-19 MED ORDER — LIDOCAINE HCL (PF) 1 % IJ SOLN
30.0000 mL | INTRAMUSCULAR | Status: DC | PRN
  Administered 2021-07-19: 30 mL via SUBCUTANEOUS

## 2021-07-19 MED ORDER — PRENATAL MULTIVITAMIN CH
1.0000 | ORAL_TABLET | Freq: Every day | ORAL | Status: DC
Start: 2021-07-19 — End: 2021-07-21
  Administered 2021-07-19: 1 via ORAL
  Filled 2021-07-19 (×2): qty 1

## 2021-07-19 MED ORDER — PHENYLEPHRINE 40 MCG/ML (10ML) SYRINGE FOR IV PUSH (FOR BLOOD PRESSURE SUPPORT): 80.0000 ug | PREFILLED_SYRINGE | INTRAVENOUS | Status: DC | PRN

## 2021-07-19 MED ORDER — DIBUCAINE (PERIANAL) 1 % EX OINT: 1.0000 "application " | TOPICAL_OINTMENT | CUTANEOUS | Status: DC | PRN

## 2021-07-19 MED ORDER — ZOLPIDEM TARTRATE 5 MG PO TABS: 5.0000 mg | ORAL_TABLET | Freq: Every evening | ORAL | Status: DC | PRN

## 2021-07-19 MED ORDER — BUPIVACAINE HCL (PF) 0.25 % IJ SOLN
INTRAMUSCULAR | Status: DC | PRN
  Administered 2021-07-19: 5 mL via EPIDURAL
  Administered 2021-07-19: 3 mL via EPIDURAL

## 2021-07-19 MED ORDER — FENTANYL-BUPIVACAINE-NACL 0.5-0.125-0.9 MG/250ML-% EP SOLN
EPIDURAL | Status: AC
  Filled 2021-07-19: qty 250

## 2021-07-19 MED ORDER — RHO D IMMUNE GLOBULIN 1500 UNIT/2ML IJ SOSY
300.0000 ug | PREFILLED_SYRINGE | Freq: Once | INTRAMUSCULAR | Status: DC
  Filled 2021-07-19: qty 2

## 2021-07-19 MED ORDER — LACTATED RINGERS IV BOLUS
1000.0000 mL | Freq: Once | INTRAVENOUS | Status: AC
  Administered 2021-07-19: 1000 mL via INTRAVENOUS

## 2021-07-19 MED ORDER — ACETAMINOPHEN 325 MG PO TABS
650.0000 mg | ORAL_TABLET | ORAL | Status: DC | PRN
  Administered 2021-07-19 (×2): 650 mg via ORAL
  Filled 2021-07-19 (×2): qty 2

## 2021-07-19 MED ORDER — SENNOSIDES-DOCUSATE SODIUM 8.6-50 MG PO TABS
2.0000 | ORAL_TABLET | ORAL | Status: DC
Start: 2021-07-19 — End: 2021-07-21
  Administered 2021-07-19 – 2021-07-21 (×3): 2 via ORAL
  Filled 2021-07-19 (×3): qty 2

## 2021-07-19 MED ORDER — BENZOCAINE-MENTHOL 20-0.5 % EX AERO
1.0000 "application " | INHALATION_SPRAY | CUTANEOUS | Status: DC | PRN
  Filled 2021-07-19: qty 56

## 2021-07-19 MED ORDER — OXYCODONE HCL 5 MG PO TABS: 5.0000 mg | ORAL_TABLET | ORAL | Status: DC | PRN

## 2021-07-19 MED ORDER — COCONUT OIL OIL
1.0000 "application " | TOPICAL_OIL | Status: DC | PRN
  Administered 2021-07-20: 1 via TOPICAL
  Filled 2021-07-19: qty 120

## 2021-07-19 NOTE — Lactation Note (Signed)
This note was copied from a baby's chart. Lactation Consultation Note  Patient Name: Kirsten Hurley [id number] Date: 07/19/2021 Reason for consult: Follow-up assessment Age:29 hours  0930-Initial lactation visit with parents of baby Kirsten Fayrene Fearing at bedside in [facility]. Baby admitted for respiratory distress; currently not having oral feedings. This is mom's first baby and she desires breastfeeding with Fayrene Fearing. Fayrene Fearing has NG tube placed. Discussed with mom set-up of DEBP in room for stimulation and promotion of onset of milk production.  Mom desires to eat, and rest before starting to pump since James did BF following delivery.  57- Mom has had 'do not disturb' sign on door and stated earlier she would lactation for set-up of pump when ready. LC did follow-up on her own prior to end of shift. Mom awake in bed, tearful, but agreeable to pump set-up and first time use. LC demonstrated how to set-up the parts/pieces and pump, education on use, suction levels, cleaning, milk storage and transport. LC instructed pump use of every 2-3 hours, and every drop will be given to [name]. Encouraged hands on pumping, hand expression before/after, and asking for assistance as needed.  Mom had no questions at this time.  Maternal Data    Feeding Mother's Current Feeding Choice: Breast Milk  LATCH Score                    Lactation Tools Discussed/Used Tools: Pump Breast pump type: Double-Electric Breast Pump Pump Education: Setup, frequency, and cleaning;Milk Storage Reason for Pumping: SCN Pumping frequency: q 2-3 hours  Interventions Interventions: DEBP  Discharge    Consult Status Consult Status: Follow-up Date: 07/20/21 Follow-up type: In-patient    Danford Bad 07/19/2021, 4:37 PM

## 2021-07-19 NOTE — Discharge Summary (Signed)
Obstetrical Discharge Summary  Patient Name: Kirsten Hurley DOB: 13-Sep-1991 MRN: [id number]  Date of Admission: 07/18/2021 Date of Delivery: 07/21/2021  Delivered by: Avelino Leeds CNM Date of Discharge: 07/21/2021  Primary OB: [facility] OMV:[id number]'S last menstrual period was 10/18/2020 (approximate). EDC Estimated Date of Delivery: 07/25/21 Gestational Age at Delivery: [redacted]w[redacted]d  Antepartum complications:    Admitting Diagnosis: Labor Secondary Diagnosis: Patient Active Problem List   Diagnosis Date Noted   NSVD (normal spontaneous vaginal delivery) 07/21/2021   Normal labor and delivery 07/19/2021   Uterine contractions during pregnancy 07/18/2021   Gestational diabetes mellitus 05/03/21 05/17/2021   Anemia affecting pregnancy 05/03/2021   Obesity during pregnancy, antepartum BMI=45.5 02/11/2021   Rh negative state in antepartum period 01/21/2021   Abnormal glucose tolerance test 01/21/2021   Supervision of high risk pregnancy, antepartum 01/17/2021    Augmentation: AROM and Pitocin Complications: None Intrapartum complications/course: none Delivery Type: spontaneous vaginal delivery Anesthesia: epidural Placenta: spontaneous Laceration: 1st degree, periurethral Episiotomy: none Newborn Data: Live born female  Birth Weight:   APGAR: 8, 9  Newborn Delivery   Birth date/time: 07/19/2021 03:37:00 Delivery type: Vaginal, Spontaneous        Postpartum Procedures: none Edinburgh:  Edinburgh Postnatal Depression Scale Screening Tool 07/19/2021  I have been able to laugh and see the funny side of things. 0  I have looked forward with enjoyment to things. 0  I have blamed myself unnecessarily when things went wrong. 1  I have been anxious or worried for no good reason. 0  I have felt scared or panicky for no good reason. 0  Things have been getting on top of me. 0  I have been so unhappy that I have had difficulty sleeping. 0  I have felt sad or miserable. 0  I have been  so unhappy that I have been crying. 0  The thought of harming myself has occurred to me. 0  Edinburgh Postnatal Depression Scale Total 1     Post partum course:  Patient had an uncomplicated postpartum course.  By time of discharge on PPD#2, her pain was controlled on oral pain medications; she had appropriate lochia and was ambulating, voiding without difficulty and tolerating regular diet.  She was deemed stable for discharge to home.    Discharge Physical Exam:  BP (!) 99/52   Pulse 86   Temp 97.8 F (36.6 C) (Oral)   Resp 20   Ht _0  (1.676 m)   Wt 129.6 kg   LMP 10/18/2020 (Approximate)   SpO2 100%   Breastfeeding Unknown   BMI 46.13 kg/m   General: NAD CV: RRR Pulm:  nl effort ABD: s/nd/nt, fundus firm and below the umbilicus Lochia: moderate Perineum: minimal edema/intact DVT Evaluation: LE non-ttp, no evidence of DVT on exam.  Hemoglobin  Date Value Ref Range Status  07/19/2021 11.9 (L) 12.0 - 15.0 g/dL Final  05/03/2021 10.0 (L) 11.1 - 15.9 g/dL Final   HCT  Date Value Ref Range Status  07/19/2021 34.9 (L) 36.0 - 46.0 % Final   Hematocrit  Date Value Ref Range Status  05/03/2021 29.8 (L) 34.0 - 46.6 % Final     Disposition: stable, discharge to home. Baby Feeding: breastmilk Baby Disposition: home with mom  Rh Immune globulin given: baby was A neg Rubella vaccine given: Immune Varivax vaccine given: immune Flu vaccine given in AP or PP setting: antepartum per patient Tdap vaccine given in AP or PP setting: given in antepartum per patient  Contraception:  Desires Nexplanon  Prenatal Labs:  Blood type/Rh A-  Antibody screen neg  Rubella Immune  Varicella Immune  RPR NR  HBsAg Neg  HIV NR  GC neg  Chlamydia neg  Genetic screening cfDNA negative   1 hour GTT 135  3 hour GTT 76, 026,378,58  GBS neg    Plan:  Kirsten Hurley was discharged to home in good condition. Follow-up appointment with delivering provider in 6 weeks.  Discharge  Medications: Allergies as of 07/21/2021   Not on File      Medication List     STOP taking these medications    Accu-Chek Softclix Lancets lancets   blood glucose meter kit and supplies   BLOOD GLUCOSE TEST STRIPS Strp       TAKE these medications    acetaminophen 325 MG tablet Commonly known as: Tylenol Take 2 tablets (650 mg total) by mouth every 4 (four) hours as needed (for pain scale < 4).   ferrous sulfate 325 (65 FE) MG tablet Take 1 tablet (325 mg total) by mouth daily with breakfast.   ibuprofen 600 MG tablet Commonly known as: ADVIL Take 1 tablet (600 mg total) by mouth every 6 (six) hours as needed.   PrePLUS 27-1 MG Tabs Take 1 tablet by mouth daily.   senna-docusate 8.6-50 MG tablet Commonly known as: Senokot-S Take 2 tablets by mouth at bedtime as needed for mild constipation.   simethicone 80 MG chewable tablet Commonly known as: MYLICON Chew 1 tablet (80 mg total) by mouth as needed for flatulence.         Follow-up Information     Department, [facility] Follow up in 6 week(s).   Why: You can do your 6 week postpartum visit with either [facility] or with [facility]. Contact information: [place] [address] [phone]         Avelino Leeds Arlyn Leak, CNM Follow up in 6 week(s).   Specialty: Obstetrics Why: You can do your 6 week postpartum visit with either [facility] or with [facility]. Contact information: [place] [address] [phone]                 Signed: Clydene Laming  07/21/2021 8:54 AM

## 2021-07-19 NOTE — Anesthesia Procedure Notes (Signed)
Epidural Patient location during procedure: OB  Staffing Anesthesiologist: Piscitello, Joseph K, MD Performed: anesthesiologist   Preanesthetic Checklist Completed: patient identified, IV checked, site marked, risks and benefits discussed, surgical consent, monitors and equipment checked, pre-op evaluation and timeout performed  Epidural Patient position: sitting Prep: ChloraPrep Patient monitoring: heart rate, continuous pulse ox and blood pressure Approach: midline Location: L3-L4 Injection technique: LOR saline  Needle:  Needle type: Tuohy  Needle gauge: 17 G Needle length: 9 cm and 9 Needle insertion depth: 7 cm Catheter type: closed end flexible Catheter size: 19 Gauge Catheter at skin depth: 13 cm Test dose: negative and 1.5% lidocaine with Epi 1:200 K  Assessment Sensory level: T10 Events: blood not aspirated, injection not painful, no injection resistance, no paresthesia and negative IV test  Additional Notes 1st attempt Pt. Evaluated and documentation done after procedure finished. Patient identified. Risks/Benefits/Options discussed with patient including but not limited to bleeding, infection, nerve damage, paralysis, failed block, incomplete pain control, headache, blood pressure changes, nausea, vomiting, reactions to medication both or allergic, itching and postpartum back pain. Confirmed with bedside nurse the patient's most recent platelet count. Confirmed with patient that they are not currently taking any anticoagulation, have any bleeding history or any family history of bleeding disorders. Patient expressed understanding and wished to proceed. All questions were answered. Sterile technique was used throughout the entire procedure. Please see nursing notes for vital signs. Test dose was given through epidural catheter and negative prior to continuing to dose epidural or start infusion. Warning signs of high block given to the patient including shortness of  breath, tingling/numbness in hands, complete motor block, or any concerning symptoms with instructions to call for help. Patient was given instructions on fall risk and not to get out of bed. All questions and concerns addressed with instructions to call with any issues or inadequate analgesia.    Patient tolerated the insertion well without immediate complications.Reason for block:procedure for pain    

## 2021-07-19 NOTE — Anesthesia Preprocedure Evaluation (Signed)
Anesthesia Evaluation  Patient identified by MRN, date of birth, ID band Patient awake    Reviewed: Allergy & Precautions, H&P , NPO status , Patient's Chart, lab work & pertinent test results, reviewed documented beta blocker date and time   Airway Mallampati: II  TM Distance: >3 FB Neck ROM: full    Dental no notable dental hx. (+) Teeth Intact   Pulmonary neg pulmonary ROS, Current Smoker, former smoker,    Pulmonary exam normal breath sounds clear to auscultation       Cardiovascular Exercise Tolerance: Good negative cardio ROS   Rhythm:regular Rate:Normal     Neuro/Psych negative neurological ROS  negative psych ROS   GI/Hepatic negative GI ROS, Neg liver ROS,   Endo/Other  diabetes, GestationalMorbid obesity  Renal/GU      Musculoskeletal   Abdominal   Peds  Hematology  (+) Blood dyscrasia, anemia ,   Anesthesia Other Findings   Reproductive/Obstetrics (+) Pregnancy                             Anesthesia Physical Anesthesia Plan  ASA: 3  Anesthesia Plan: Epidural   Post-op Pain Management:    Induction:   PONV Risk Score and Plan:   Airway Management Planned:   Additional Equipment:   Intra-op Plan:   Post-operative Plan:   Informed Consent: I have reviewed the patients History and Physical, chart, labs and discussed the procedure including the risks, benefits and alternatives for the proposed anesthesia with the patient or authorized representative who has indicated his/her understanding and acceptance.       Plan Discussed with:   Anesthesia Plan Comments:         Anesthesia Quick Evaluation

## 2021-07-20 ENCOUNTER — Encounter: Payer: Self-pay | Admitting: Obstetrics and Gynecology

## 2021-07-20 LAB — TYPE AND SCREEN
ABO/RH(D): A NEG
Antibody Screen: POSITIVE

## 2021-07-20 NOTE — Progress Notes (Signed)
Postpartum Day  1  Subjective: no complaints, up ad lib, voiding, and tolerating PO  Doing well, no concerns. Ambulating without difficulty, pain managed with PO meds, tolerating regular diet, and voiding without difficulty.   No fever/chills, chest pain, shortness of breath, nausea/vomiting, or leg pain. No nipple or breast pain. No headache, visual changes, or RUQ/epigastric pain.  Objective: BP 135/79 (BP Location: Left Arm)   Pulse 82   Temp 98.1 F (36.7 C) (Oral)   Resp 20   Ht 5\' 6"  (1.676 m)   Wt 129.6 kg   LMP 10/18/2020 (Approximate)   SpO2 98%   Breastfeeding Unknown   BMI 46.13 kg/m    Physical Exam:  General: alert, cooperative, and no distress Breasts: soft/nontender CV: RRR Pulm: nl effort, CTABL Abdomen: soft, non-tender, active bowel sounds Uterine Fundus: firm Perineum: minimal edema, repair well approximated Lochia: appropriate DVT Evaluation: No evidence of DVT seen on physical exam.  Recent Labs    07/18/21 2336 07/19/21 0639  HGB 13.1 11.9*  HCT 37.2 34.9*  WBC 22.7* 20.7*  PLT 222 202    Assessment/Plan: 30 y.o. G2P1011 postpartum day # 1  -Continue routine postpartum care -Lactation consult PRN for breastfeeding  -Infant currently in SCN -CBC reviewed - hemodynamically stable and asymptomatic -Immunization status:   all immunizations up to date   Disposition: Continue inpatient postpartum care    LOS: 1 day   37, CNM 07/20/2021, 9:24 AM   ----- 07/22/2021  Certified Nurse Midwife [facility]

## 2021-07-20 NOTE — Lactation Note (Signed)
This note was copied from a baby's chart. Lactation Consultation Note  Patient Name: Kirsten Hurley EQAST'M Date: 07/20/2021 Reason for consult: Follow-up assessment Age:29 hours  Lactation follow-up at bedside in SCN. Baby awake and alert, Neo offered PO feeding with cues, mom told and plans feeding at breast. Baby was cuing/crying/upset with late hunger cues when parents arrived. Multiple attempts made to latch baby in cradle position to R breast. Baby on/off, unable to sustain latch- pacifier may be culprit. Brought into football hold for more containment on R breast, again baby on/off, crying/upset, hungry. LC was able to hand express drops onto nipple and get 3-4 sucks from baby before he pushed back upset.  Size 41mm NS placed, again baby remained on/off breast upset and crying. RN started NG feeding, and then transitioned to SNS/31French behind nipple shield allowing it to flow into the shield. Baby did calm but remained fussy, and on/off breast, but emptying the shield with 5-8 suck patterns.  After 20 minutes of feeding at the breast baby became tired, consumed total of 20mL, and was gavage fed the remaining.   LC provided reassurance and praise to the parents. Discussed the impact that pacifier may have on latching, benefit of nipple shield at this time, and timing of cue-based feedings. Reviewed early cues, recommended to be at bedside at least 15 minutes before scheduled feeding time, and encouraged beside feedings over night tonight with assistance/support from staff.  Maternal Data Has patient been taught Hand Expression?: Yes Does the patient have breastfeeding experience prior to this delivery?: No  Feeding Mother's Current Feeding Choice: Breast Milk  LATCH Score Latch: Repeated attempts needed to sustain latch, nipple held in mouth throughout feeding, stimulation needed to elicit sucking reflex.  Audible Swallowing: A few with stimulation (w/ supplement)  Type of Nipple:  Everted at rest and after stimulation  Comfort (Breast/Nipple): Soft / non-tender  Hold (Positioning): Assistance needed to correctly position infant at breast and maintain latch.  LATCH Score: 7   Lactation Tools Discussed/Used Tools: Supplemental Nutrition System;31F feeding tube / Syringe;Nipple Shields Nipple shield size: 20 Breast pump type: Double-Electric Breast Pump Reason for Pumping: SCN  Interventions Interventions: Breast feeding basics reviewed;Assisted with latch;Hand express;Adjust position;Support pillows;Position options;Breast compression;Education (nipple shield)  Discharge    Consult Status Consult Status: Follow-up Date: 07/21/21 Follow-up type: In-patient    Kirsten Hurley 07/20/2021, 3:54 PM

## 2021-07-20 NOTE — Anesthesia Postprocedure Evaluation (Signed)
Anesthesia Post Note  Patient: KIMYATA MILICH  Procedure(s) Performed: AN AD HOC LABOR EPIDURAL  Patient location during evaluation: Mother Baby Anesthesia Type: Epidural Level of consciousness: awake and alert Pain management: pain level controlled Vital Signs Assessment: post-procedure vital signs reviewed and stable Respiratory status: spontaneous breathing, nonlabored ventilation and respiratory function stable Cardiovascular status: stable Postop Assessment: no headache, no backache and epidural receding Anesthetic complications: no   No notable events documented.   Last Vitals:  Vitals:   07/19/21 2225 07/20/21 0845  BP: 121/70 135/79  Pulse: 90 82  Resp:  20  Temp: 36.9 C 36.7 C  SpO2: 100% 98%    Last Pain:  Vitals:   07/20/21 0845  TempSrc: Oral  PainSc:                  Lynden Oxford

## 2021-07-21 LAB — CBC WITH DIFFERENTIAL/PLATELET
Abs Immature Granulocytes: 0.07 10*3/uL (ref 0.00–0.07)
Basophils Absolute: 0.1 10*3/uL (ref 0.0–0.1)
Basophils Relative: 1 %
Eosinophils Absolute: 0.2 10*3/uL (ref 0.0–0.5)
Eosinophils Relative: 2 %
HCT: 34.9 % — ABNORMAL LOW (ref 36.0–46.0)
Hemoglobin: 11.8 g/dL — ABNORMAL LOW (ref 12.0–15.0)
Immature Granulocytes: 1 %
Lymphocytes Relative: 24 %
Lymphs Abs: 2.2 10*3/uL (ref 0.7–4.0)
MCH: 32.1 pg (ref 26.0–34.0)
MCHC: 33.8 g/dL (ref 30.0–36.0)
MCV: 94.8 fL (ref 80.0–100.0)
Monocytes Absolute: 0.5 10*3/uL (ref 0.1–1.0)
Monocytes Relative: 6 %
Neutro Abs: 6 10*3/uL (ref 1.7–7.7)
Neutrophils Relative %: 66 %
Platelets: 213 10*3/uL (ref 150–400)
RBC: 3.68 MIL/uL — ABNORMAL LOW (ref 3.87–5.11)
RDW: 14.4 % (ref 11.5–15.5)
WBC: 8.9 10*3/uL (ref 4.0–10.5)
nRBC: 0 % (ref 0.0–0.2)

## 2021-07-21 MED ORDER — ACETAMINOPHEN 325 MG PO TABS: 650.0000 mg | ORAL_TABLET | ORAL | Status: DC | PRN

## 2021-07-21 MED ORDER — IBUPROFEN 600 MG PO TABS: 600.0000 mg | ORAL_TABLET | Freq: Four times a day (QID) | ORAL | Status: DC | PRN

## 2021-07-21 MED ORDER — SENNOSIDES-DOCUSATE SODIUM 8.6-50 MG PO TABS: 2.0000 | ORAL_TABLET | Freq: Every evening | ORAL | Status: DC | PRN

## 2021-07-21 MED ORDER — SIMETHICONE 80 MG PO CHEW: 80.0000 mg | CHEWABLE_TABLET | ORAL | 0 refills | Status: DC | PRN

## 2021-07-21 NOTE — Progress Notes (Signed)
Pt d/c home in the company of husband and mother. D/c instructions reviewed with patient. Pt verbalized understanding. Pt refused wheelchair.

## 2021-07-21 NOTE — Lactation Note (Signed)
This note was copied from a baby's chart. Lactation Consultation Note  Patient Name: Boy Laurin Paulo IAXKP'V Date: 07/21/2021   Age:29 hours  Lactation check-in with mom before her anticipated discharge. Mom states that she has continued pumping with volume slowly increasing and her starting to feel changes in her breast. Baby has been given several bottles over the evening/night, and has not been back to the breast per mom. Mom is planning to leave after next touch time with baby (12pm) and will return.  Mom has a personal EBP at home. LC reviewed with mom pumping basics: routine/frequency, cleaning, milk storage and transport. Reviewed breast changes- breast fullness and engorgement and management of both as well as nipple care. Reviewed normal course of lactation and milk supply and demand. Encouraged to continue working with putting baby to breast when visiting if this is the feeding pattern she desires. Encouraged to call for [facility] support anytime at bedside with baby or with questions re: pumping.  Danford Bad 07/21/2021, 12:48 PM

## 2021-07-26 ENCOUNTER — Ambulatory Visit: Payer: Self-pay

## 2021-07-26 NOTE — Lactation Note (Signed)
This note was copied from a baby's chart. Lactation Consultation Note  Patient Name: Kirsten Hurley IONGE'X Date: 07/26/2021 Reason for consult: Follow-up assessment;Primapara;NICU baby;Term Age:29 days  Final lactation visit prior to discharge from SCN. Baby is 87 days old, mom has been pumping. Baby has been receiving mostly formula via bottle in SCN, however mom states she is pumping 2-3oz/breast 3-4x/day.  LC at bedside to assist with attempted feeding at the breast prior to going home. Initial efforts made without swaddle and in cradle hold. Baby not settling or accepting of the breast, even with nipple shield.  Baby then swaddled and brought to breast in football hold. LC hand expressed some mature milk into the shield, baby latched easily and more calmly remaining at the breast for total of 14 minutes with spontaneous and audible swallowing.   Baby began to play at the breast, given to dad for burping, returned to breast but only for a few minutes- stool diaper detected and then parents opted for "top off" of formula due to long ride (over 45 minutes) home.  LC and OT present for education prior to discharge re: feeding frequency, paced feeding. LC provided additional education on pumping/emptying of the breast every 2-3 hours, reviewing milk supply and demand, offering of breast with swaddle, tips for keeping him awake and feeding, and offering of second breast if needed.  Healing guidance given on R breast (mom has sores from tight flange that are now healing)- and encouraged baby be put to breast as soon as its tolerable- mom agreed.  Information for outpatient lactation services and community breastfeeding support given. Encouraged to call with questions and for ongoing BF support.  Maternal Data Has patient been taught Hand Expression?: Yes Does the patient have breastfeeding experience prior to this delivery?: No  Feeding Mother's Current Feeding Choice: Breast Milk Nipple  Type: Slow - flow  LATCH Score Latch: Grasps breast easily, tongue down, lips flanged, rhythmical sucking. (with containment)  Audible Swallowing: Spontaneous and intermittent  Type of Nipple: Everted at rest and after stimulation  Comfort (Breast/Nipple): Soft / non-tender  Hold (Positioning): Assistance needed to correctly position infant at breast and maintain latch.  LATCH Score: 9   Lactation Tools Discussed/Used Tools: Nipple Shields Nipple shield size: 20  Interventions Interventions: Breast feeding basics reviewed;Assisted with latch;Hand express;Adjust position;Support pillows;Position options;Education  Discharge Discharge Education: Outpatient recommendation Pump: Personal  Consult Status Consult Status: Complete Date: 07/26/21 Follow-up type: Call as needed    Danford Bad 07/26/2021, 2:16 PM

## 2021-08-30 ENCOUNTER — Telehealth: Payer: Self-pay | Admitting: Family Medicine

## 2021-08-30 NOTE — Telephone Encounter (Signed)
Call to client who reports calling to notify clinic that she wants Nexplanon at her post-partum appt on Friday 09/02/21. Per client, did not notify clerical when appt scheduled. As client in a 20 minute appt for post-partum exam and no available appts on Friday, counseled appt needed to be rescheduled. Offered appt on Monday, 09/05/2021 and client requested 09/12/21 appt. Appt scheduled as requested and client counseled no intercourse prior to appt (last sex ~ 08/16/21 with condom). Jossie Ng, RN

## 2021-09-01 ENCOUNTER — Ambulatory Visit: Payer: Medicaid Other

## 2021-09-12 ENCOUNTER — Ambulatory Visit: Payer: Medicaid Other | Admitting: Physician Assistant

## 2021-09-12 ENCOUNTER — Other Ambulatory Visit: Payer: Self-pay

## 2021-09-12 ENCOUNTER — Ambulatory Visit: Payer: Medicaid Other

## 2021-09-12 VITALS — BP 140/91 | Ht 66.0 in

## 2021-09-12 DIAGNOSIS — Z3009 Encounter for other general counseling and advice on contraception: Secondary | ICD-10-CM | POA: Diagnosis not present

## 2021-09-12 DIAGNOSIS — Z3202 Encounter for pregnancy test, result negative: Secondary | ICD-10-CM | POA: Diagnosis not present

## 2021-09-12 DIAGNOSIS — Z30017 Encounter for initial prescription of implantable subdermal contraceptive: Secondary | ICD-10-CM | POA: Diagnosis not present

## 2021-09-12 DIAGNOSIS — Z113 Encounter for screening for infections with a predominantly sexual mode of transmission: Secondary | ICD-10-CM

## 2021-09-12 DIAGNOSIS — Z0000 Encounter for general adult medical examination without abnormal findings: Secondary | ICD-10-CM

## 2021-09-12 LAB — PREGNANCY, URINE: Preg Test, Ur: NEGATIVE

## 2021-09-12 LAB — HM HIV SCREENING LAB: HM HIV Screening: NEGATIVE

## 2021-09-12 MED ORDER — ETONOGESTREL 68 MG ~~LOC~~ IMPL
68.0000 mg | DRUG_IMPLANT | Freq: Once | SUBCUTANEOUS | Status: AC
  Administered 2021-09-12: 68 mg via SUBCUTANEOUS

## 2021-09-13 ENCOUNTER — Encounter: Payer: Self-pay | Admitting: Physician Assistant

## 2021-09-13 NOTE — Progress Notes (Signed)
[facility] DEPARTMENT [facility] [address] Main Number: [phone]    Family Planning Visit- Initial Visit  Subjective:  Kirsten Hurley is a 30 y.o.  G2P1011   being seen today for an initial annual visit and to discuss contraceptive options.  The patient is currently using Condoms for pregnancy prevention. Patient reports she does not want a pregnancy in the next year.  Patient has the following medical conditions has Supervision of high risk pregnancy, antepartum; Rh negative state in antepartum period; Abnormal glucose tolerance test; Obesity during pregnancy, antepartum BMI=45.5; Anemia affecting pregnancy; Gestational diabetes mellitus 05/03/21; Uterine contractions during pregnancy; Normal labor and delivery; and NSVD (normal spontaneous vaginal delivery) on their problem list.  Chief Complaint  Patient presents with   Contraception    PP exam and Nexplanon insertion.    Patient reports that she has been doing well since delivery.  States that she attempted breastfeeding but did not make enough milk so is not feeding with formula.  Denies any concerns today.  [place] score of 1 today.  Per chart review CBE and pap due 2025.  Patient denies any concerns.   Body mass index is 46.13 kg/m. - Patient is eligible for diabetes screening based on BMI and age >39?  not applicable HA1C ordered? not applicable  Patient reports 1  partner/s in last year. Desires STI screening?  No - pt declines  Has patient been screened once for HCV in the past?  No  No results found for: HCVAB  Does the patient have current drug use (including MJ), have a partner with drug use, and/or has been incarcerated since last result? No  If yes-- Screen for HCV through [facility]   Does the patient meet criteria for HBV testing? No  Criteria:  -Household, sexual or needle sharing contact with HBV -History of drug use -HIV positive -Those with known Hep  C   Health Maintenance Due  Topic Date Due   COVID-19 Vaccine (1) Never done   URINE MICROALBUMIN  Never done   HIV Screening  Never done   INFLUENZA VACCINE  07/04/2021    Review of Systems  All other systems reviewed and are negative.  The following portions of the patient's history were reviewed and updated as appropriate: allergies, current medications, past family history, past medical history, past social history, past surgical history and problem list. Problem list updated.   See flowsheet for other program required questions.  Objective:   Vitals:   09/12/21 1308  BP: (!) 140/91  Height: 5\' 6"  (1.676 m)    Physical Exam Vitals and nursing note reviewed.  Constitutional:      General: She is not in acute distress.    Appearance: Normal appearance.  HENT:     Head: Normocephalic and atraumatic.     Mouth/Throat:     Mouth: Mucous membranes are moist.     Pharynx: Oropharynx is clear. No oropharyngeal exudate or posterior oropharyngeal erythema.  Eyes:     Conjunctiva/sclera: Conjunctivae normal.  Cardiovascular:     Rate and Rhythm: Normal rate and regular rhythm.  Pulmonary:     Effort: Pulmonary effort is normal.     Breath sounds: Normal breath sounds.  Abdominal:     Palpations: Abdomen is soft. There is no mass.     Tenderness: There is no abdominal tenderness. There is no guarding or rebound.  Musculoskeletal:     Cervical back: Neck supple. No tenderness.  Lymphadenopathy:     Cervical: No cervical adenopathy.  Skin:    General: Skin is warm and dry.     Findings: No bruising, erythema, lesion or rash.  Neurological:     Mental Status: She is alert and oriented to person, place, and time.  Psychiatric:        Mood and Affect: Mood normal.        Behavior: Behavior normal.        Thought Content: Thought content normal.        Judgment: Judgment normal.      Assessment and Plan:  Kirsten Hurley is a 30 y.o. female presenting to the  [facility] Department for an initial annual wellness/contraceptive visit  Contraception counseling: Reviewed all forms of birth control options in the tiered based approach. available including abstinence; over the counter/barrier methods; hormonal contraceptive medication including pill, patch, ring, injection,contraceptive implant, ECP; hormonal and nonhormonal IUDs; permanent sterilization options including vasectomy and the various tubal sterilization modalities. Risks, benefits, and typical effectiveness rates were reviewed.  Questions were answered.  Written information was also given to the patient to review.  Patient desires Nexplanon insertion, this was prescribed for patient. She will follow up in  1 year and prn for surveillance.  She was told to call with any further questions, or with any concerns about this method of contraception.  Emphasized use of condoms 100% of the time for STI prevention.  Patient was not a candidate for ECP today.   1. Encounter for counseling regarding contraception Reviewed with patient as above re: BCM options. Reviewed with patient normal SE of Nexplanon and when to call clinic with concerns. Enc condoms with all sex for STD protection.   2. Screening for STD (sexually transmitted disease) Await test results.  Counseled patient that RN will call if needs to RTC for treatment once results are back.  Nicki Reaper - HIV [place] - Syphilis Serology, [facility]  3. Well woman exam (no gynecological exam) Reviewed with patient healthy habits to maintain general health. Enc MVI 1 po daily. Patient declines to schedule for 2 hr post prandial lab today and states that she will call back to schedule once she knows schedule. Enc to establish with/ follow up with PCP for primary care concerns, age appropriate screenings and illness.  - Pregnancy, urine  4. Nexplanon insertion Nexplanon Insertion Procedure Patient identified, informed consent performed,  consent signed.   Patient does understand that irregular bleeding is a very common side effect of this medication. She was advised to have backup contraception after placement. Patient was determined to meet WHO criteria for not being pregnant. Appropriate time out taken.  The insertion site was identified 8-10 cm (3-4 inches) from the medial epicondyle of the humerus and 3-5 cm (1.25-2 inches) posterior to (below) the sulcus (groove) between the biceps and triceps muscles of the patient's left arm and marked.  Patient was prepped with alcohol swab and then injected with 3 ml of 1% lidocaine.  Arm was prepped with chlorhexidene, Nexplanon removed from packaging,  Device confirmed in needle, then inserted full length of needle and withdrawn per handbook instructions. Nexplanon was able to palpated in the patient's arm; patient palpated the insert herself. There was minimal blood loss.  Patient insertion site covered with guaze and a pressure bandage to reduce any bruising.  The patient tolerated the procedure well and was given post procedure instructions.    Counseled patient to take OTC analgesic  starting as soon as lidocaine starts to wear off and take regularly for at least 48 hr to decrease discomfort.  Specifically to take with food or milk to decrease stomach upset and for IB 600 mg (3 tablets) every 6 hrs; IB 800 mg (4 tablets) every 8 hrs; or Aleve 2 tablets every 12 hrs.   - etonogestrel (NEXPLANON) implant 68 mg     Return in about 2 weeks (around 09/26/2021) for for lab only; 2 hr post priandial.  No future appointments.  Matt Holmes, PA

## 2021-10-04 ENCOUNTER — Telehealth: Payer: Self-pay | Admitting: Family Medicine

## 2021-10-04 NOTE — Telephone Encounter (Signed)
Pt called due to bleeding please call to advise what to do

## 2021-10-04 NOTE — Telephone Encounter (Signed)
Pt has had her menses x 10 days.  She is not having heavy bleeding.  She wanted to know if this was normal with the Nexplanon.  Pt notified that she can have irregular bleeding x 3 months, however, she will need to call if she has heavy bleeding

## 2022-02-15 NOTE — Progress Notes (Signed)
02-15-2022 Received a medical records request from [facility], [employer] [place] [employer] [place] for prenatal and postpartum care records from 2020 to 2022 for patient Kirsten Hurley, [id number]. Per [employer] request letter, medical records requested for HEDIS audit. Per request copies of all prenatal and postpartum office visit notes including all lab results from 01-17-2021 to 09-12-2021 (175 pages) mailed to [facility] [address], [place], [place] [address]. Records mailed certified mail [phone]  [phone]. [name] RN.  ?

## 2022-06-21 ENCOUNTER — Encounter: Payer: Self-pay | Admitting: Advanced Practice Midwife

## 2022-06-21 ENCOUNTER — Ambulatory Visit (LOCAL_COMMUNITY_HEALTH_CENTER): Payer: Medicaid Other | Admitting: Advanced Practice Midwife

## 2022-06-21 VITALS — BP 141/91 | HR 76 | Ht 66.0 in | Wt 283.6 lb

## 2022-06-21 DIAGNOSIS — Z3009 Encounter for other general counseling and advice on contraception: Secondary | ICD-10-CM | POA: Diagnosis not present

## 2022-06-21 DIAGNOSIS — Z3046 Encounter for surveillance of implantable subdermal contraceptive: Secondary | ICD-10-CM

## 2022-06-21 DIAGNOSIS — O24419 Gestational diabetes mellitus in pregnancy, unspecified control: Secondary | ICD-10-CM | POA: Insufficient documentation

## 2022-06-21 DIAGNOSIS — R030 Elevated blood-pressure reading, without diagnosis of hypertension: Secondary | ICD-10-CM | POA: Insufficient documentation

## 2022-06-21 DIAGNOSIS — E669 Obesity, unspecified: Secondary | ICD-10-CM | POA: Insufficient documentation

## 2022-06-21 DIAGNOSIS — F17200 Nicotine dependence, unspecified, uncomplicated: Secondary | ICD-10-CM

## 2022-06-21 NOTE — Progress Notes (Signed)
   [facility] problem visit  [facility]  Subjective:  Kirsten Hurley is a 32 y.o. SWF G2P1 (11 mo son) being seen today for spotting/menses q 2 wks onset 2-3 months ago. Nexplanon inserted 09/12/21. Last PE 09/12/21 (PP) and did not come back for 2 hour post prandial. Last sex 06/19/22 without condom; with current partner x 3 years; 1 partner in last 3 mo.  LMP 06/13/22. Smoking 1/2-1 ppd, vaping 09/2021, last MJ 2021. Last ETOH 05/26/22 (1/2 bottle liquor) 2x/mo. Not working. Living with FOB and their son and FOB's parents.   Chief Complaint  Patient presents with   Contraception    Nexplanon check. "I'm having my periods too frequently"    HPI   Does the patient have a current or past history of drug use? Yes   No components found for: "HCV"]   Health Maintenance Due  Topic Date Due   COVID-19 Vaccine (1) Never done    Review of Systems  All other systems reviewed and are negative.   The following portions of the patient's history were reviewed and updated as appropriate: allergies, current medications, past family history, past medical history, past social history, past surgical history and problem list. Problem list updated.   See flowsheet for other program required questions.  Objective:   Vitals:   06/21/22 1054  BP: (!) 141/91  Pulse: 76  Weight: 283 lb 9.6 oz (128.6 kg)  Height: 5\' 6"  (1.676 m)    Physical Exam Vitals and nursing note reviewed.  Constitutional:      Appearance: Normal appearance. She is obese.  HENT:     Head: Normocephalic.  Pulmonary:     Effort: Pulmonary effort is normal.  Musculoskeletal:        General: Normal range of motion.  Skin:    General: Skin is warm and dry.  Neurological:     Mental Status: She is alert. Mental status is at baseline.       Assessment and Plan:  Kirsten Hurley is a 31 y.o. female presenting to the [facility] Department for a Women's Health problem  visit  1. Obesity, unspecified classification, unspecified obesity type, unspecified whether serious comorbidity present   2. Elevated blood pressure reading 06/21/22=141/91 Referred to primary care MD for evaluation  3. Smoker 1/2-1 ppd Counseled via 5 A's to stop smoking  4. Gestational diabetes mellitus (GDM), antepartum, gestational diabetes method of control unspecified Referred to primary care MD for 2 hour pp (never had)   6. Encounter for surveillance of implantable subdermal contraceptive Counseled to try Ibuprofen 800 mg po q8 hours x 5 days Can't use ocp's due to HTN     No follow-ups on file.  No future appointments.  06/23/22, CNM

## 2022-06-21 NOTE — Progress Notes (Signed)
Pt desires no STD testing today. Only wants to discuss frequent periods with Nexplanon. Pt in need of PCP. PCP list given.  Lethea Killings RN

## 2022-07-27 IMAGING — US US MFM OB FOLLOW-UP
1 series · 14 of 28 positions shown · non-contrast
Comparison: none

[Series 1: us mfm ob follow-up · 14 of 63 slices shown]
[im 3/63]
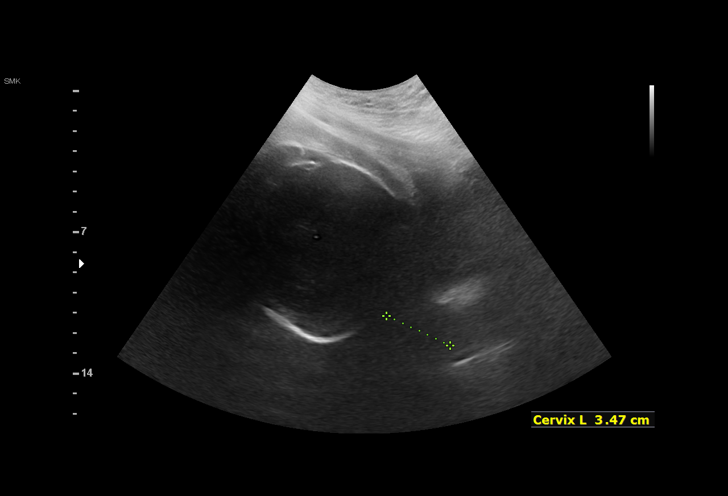
[im 7/63]
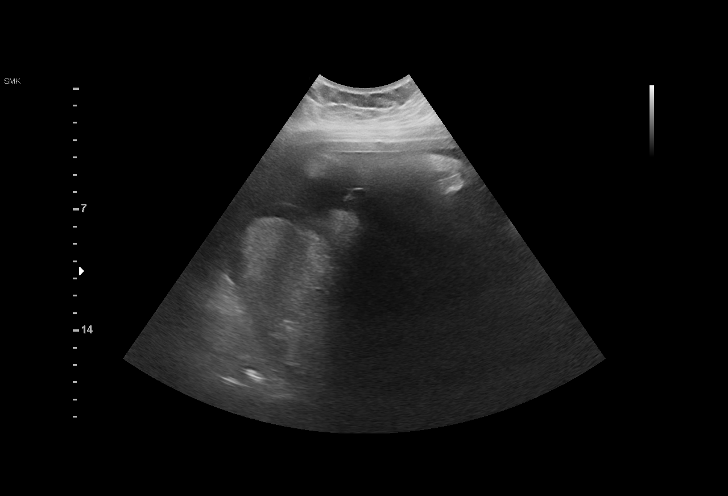
[im 12/63]
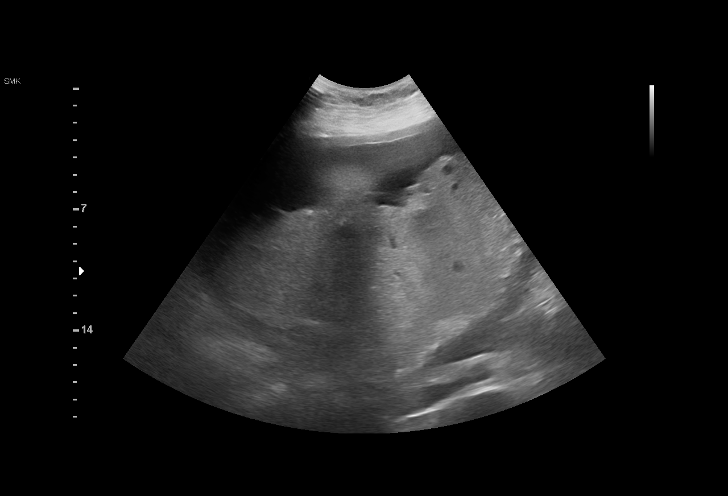
[im 17/63]
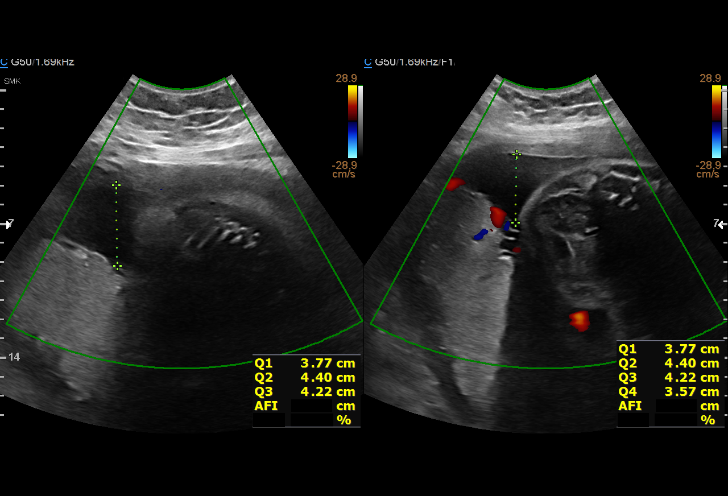
[im 21/63]
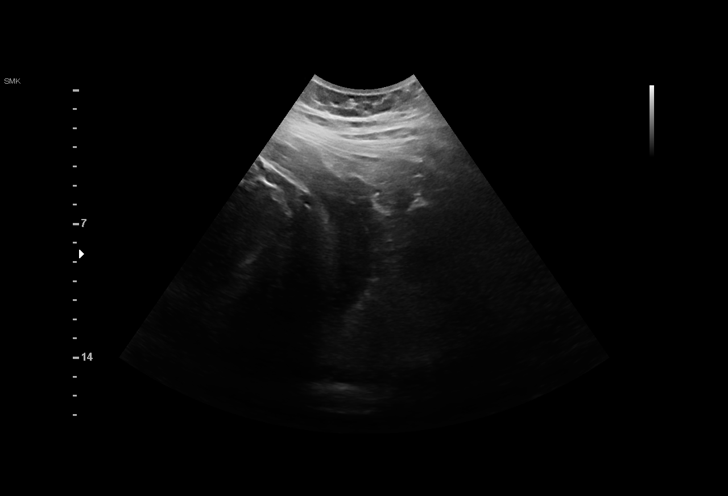
[im 26/63]
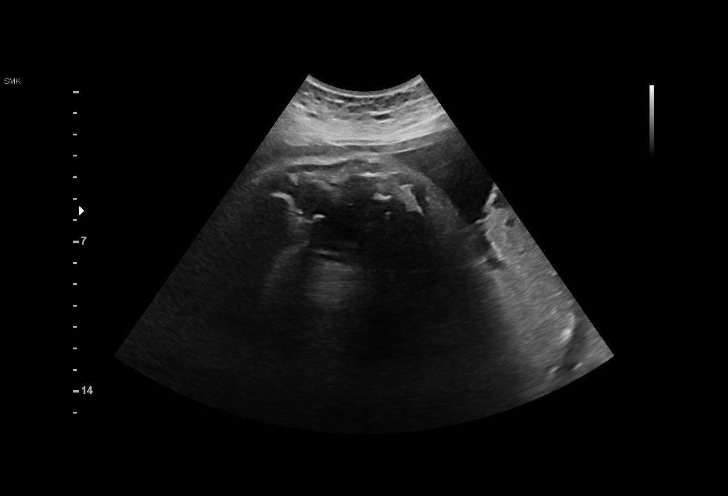
[im 30/63]
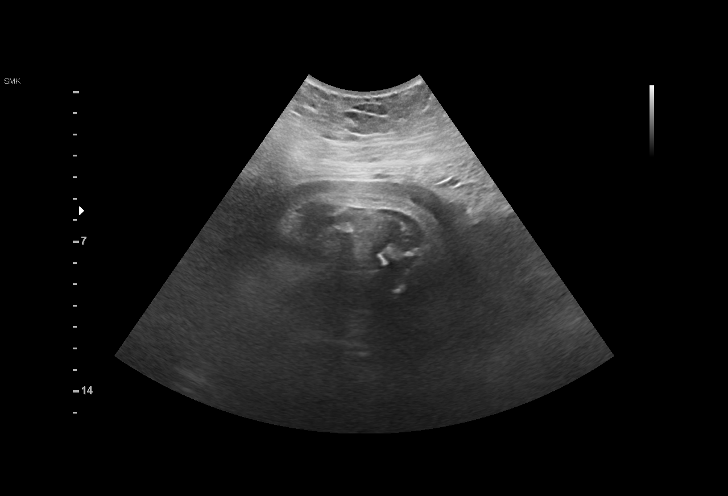
[im 35/63]
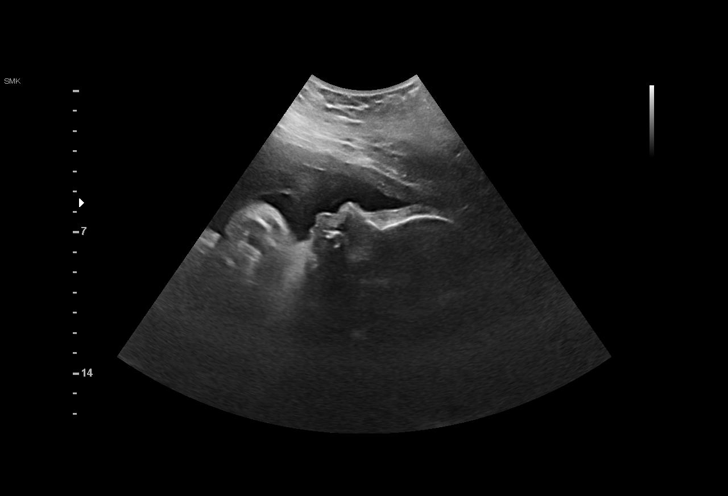
[im 40/63]
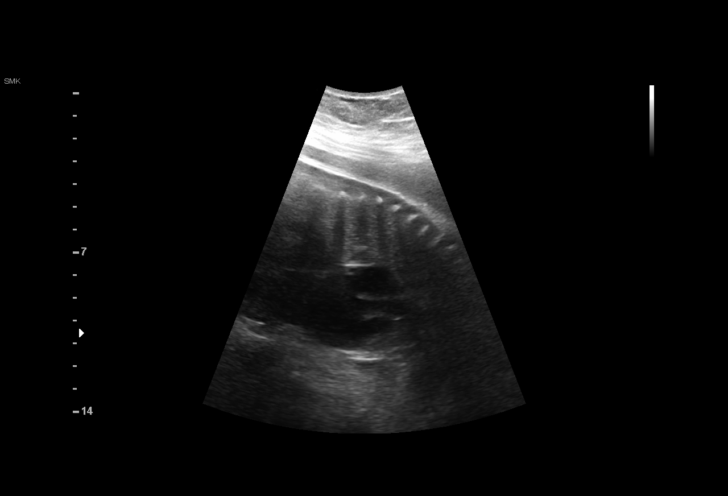
[im 44/63]
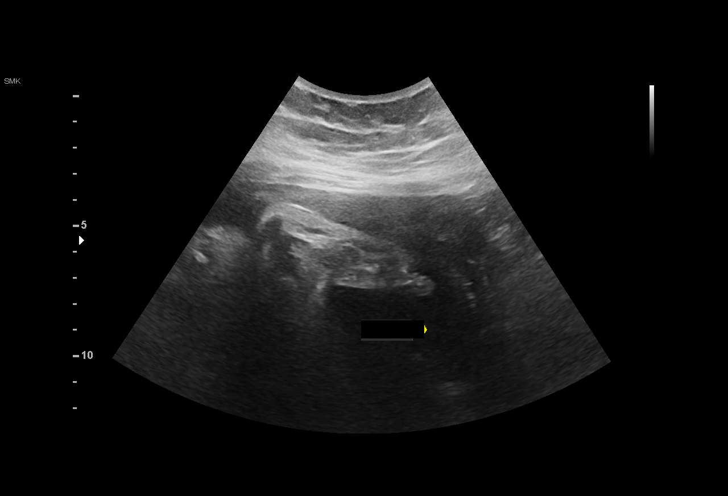
[im 49/63]
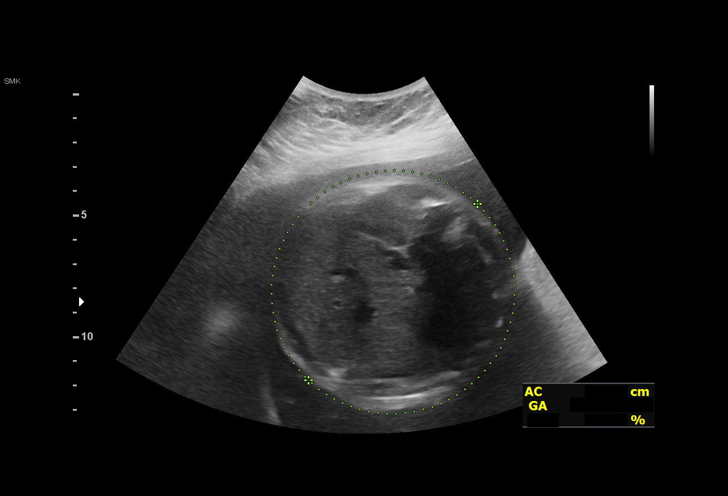
[im 53/63]
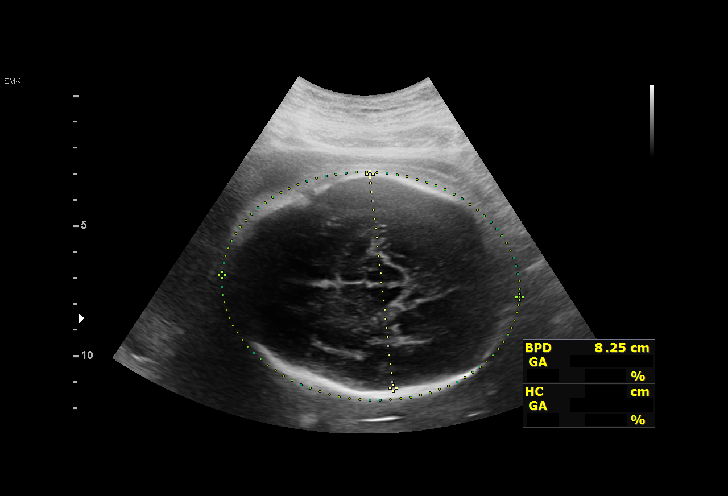
[im 58/63]
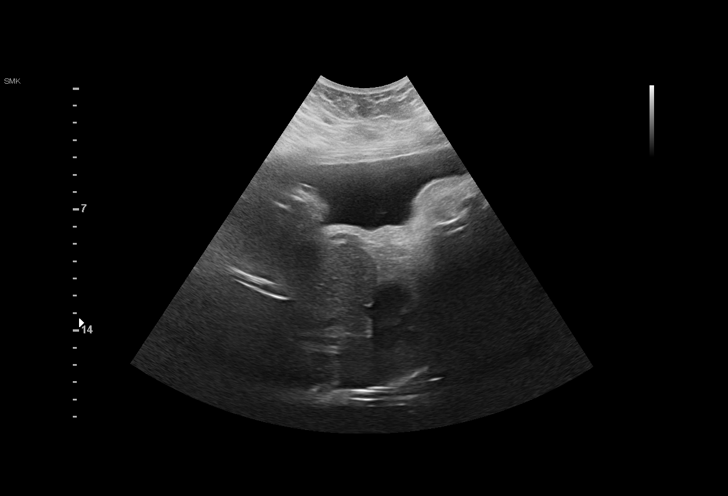
[im 63/63]
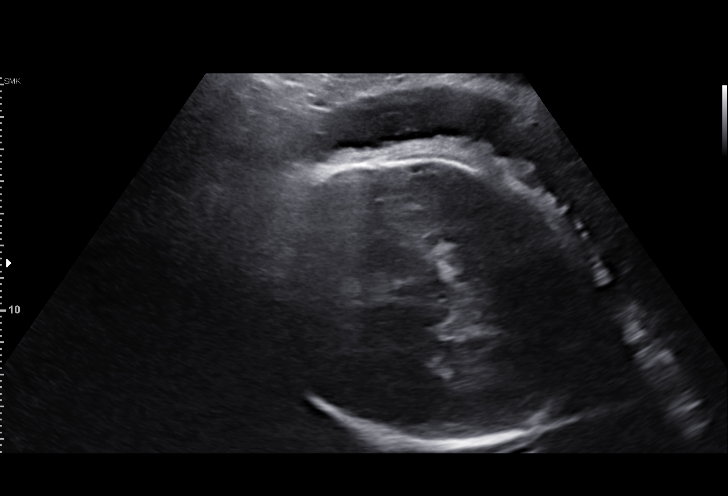

[14 of 28 positions shown; findings below may reference images not displayed]

NIKKY CNM

Indications

 Gestational diabetes in pregnancy, diet
 controlled
 Obesity complicating pregnancy, third
 trimester
 33 weeks gestation of pregnancy
 Uterine size-date discrepancy, third trimester
Fetal Evaluation

 Num Of Fetuses:         1
 Fetal Heart Rate(bpm):  135
 Cardiac Activity:       Observed
 Presentation:           Cephalic
 Placenta:               Right lateral

 AFI Sum(cm)     %Tile       Largest Pocket(cm)
 16              58

 RUQ(cm)       RLQ(cm)       LUQ(cm)        LLQ(cm)

Biometry

 BPD:      84.6  mm     G. Age:  34w 1d         65  %    CI:         70.1   %    70 - 86
                                                         FL/HC:      19.6   %    19.9 -
 HC:      322.3  mm     G. Age:  36w 3d         86  %    HC/AC:      1.03        0.96 -
 AC:      313.7  mm     G. Age:  35w 2d         93  %    FL/BPD:     74.6   %    71 - 87
 FL:       63.1  mm     G. Age:  32w 4d         20  %    FL/AC:      20.1   %    20 - 24
 HUM:      58.5  mm     G. Age:  33w 6d         68  %
 LV:        6.2  mm
 Est. FW:    5000  gm      5 lb 7 oz     77  %
OB History

 Gravidity:    2         Term:   0        Prem:   0        SAB:   1
 TOP:          0       Ectopic:  0        Living: 0
Gestational Age

 LMP:           33w 3d        Date:  10/18/20                 EDD:   07/25/21
 U/S Today:     34w 4d                                        EDD:   07/17/21
 Best:          33w 3d     Det. By:  LMP  (10/18/20)          EDD:   07/25/21
Anatomy

 Cranium:               Previously seen        Aortic Arch:            Not well visualized
 Cavum:                 Previously seen        Ductal Arch:            Not well visualized
 Ventricles:            Appears normal         Diaphragm:              Appears normal
 Choroid Plexus:        Previously seen        Stomach:                Appears normal, left
                                                                       sided
 Cerebellum:            Previously seen        Abdomen:                Previously seen
 Posterior Fossa:       Previously seen        Abdominal Wall:         Not well visualized
 Nuchal Fold:           Not applicable (>20    Cord Vessels:           Not well visualized
                        wks GA)
 Face:                  Profile appears        Kidneys:                Appear normal
                        normal
 Lips:                  Appears normal         Bladder:                Appears normal
 Heart:                 Not well visualized    Spine:                  Appears normal
 RVOT:                  Not well visualized    Upper Extremities:      Previously seen
 LVOT:                  Not well visualized    Lower Extremities:      Previously seen
Cervix Uterus Adnexa

 Cervix
 Length:           3.47  cm.

 Right Ovary
 Not visualized. No adnexal mass visualized.

 Left Ovary
 Not visualized. No adnexal mass visualized.
Comments

 This patient was seen for a follow up growth scan due to diet-
 controlled gestational diabetes.  She denies any problems
 since her last exam.
 She was informed that the fetal growth and amniotic fluid
 level appears appropriate for her gestational age.
 The views of the fetal anatomy continue to be limited today
 due to her advanced gestational age and maternal body
 habitus.
 A follow up exam was scheduled in 4 weeks to assess the
 fetal growth closer to delivery.
 Weekly fetal testing would be indicated should she require
 insulin or metformin for treatment of gestational diabetes.

## 2022-08-08 ENCOUNTER — Ambulatory Visit: Payer: Medicaid Other

## 2022-08-09 ENCOUNTER — Ambulatory Visit (LOCAL_COMMUNITY_HEALTH_CENTER): Payer: Medicaid Other | Admitting: Advanced Practice Midwife

## 2022-08-09 VITALS — BP 132/85 | HR 94 | Resp 16 | Ht 66.0 in | Wt 286.0 lb

## 2022-08-09 DIAGNOSIS — Z309 Encounter for contraceptive management, unspecified: Secondary | ICD-10-CM | POA: Diagnosis not present

## 2022-08-09 DIAGNOSIS — Z30013 Encounter for initial prescription of injectable contraceptive: Secondary | ICD-10-CM | POA: Diagnosis not present

## 2022-08-09 DIAGNOSIS — Z3046 Encounter for surveillance of implantable subdermal contraceptive: Secondary | ICD-10-CM

## 2022-08-09 DIAGNOSIS — Z3009 Encounter for other general counseling and advice on contraception: Secondary | ICD-10-CM

## 2022-08-09 MED ORDER — MEDROXYPROGESTERONE ACETATE 150 MG/ML IM SUSP
150.0000 mg | Freq: Once | INTRAMUSCULAR | Status: AC
  Administered 2022-08-09: 150 mg via INTRAMUSCULAR

## 2022-08-09 NOTE — Progress Notes (Signed)
[facility] DEPARTMENT [facility] [address] Main Number: [phone]  Contraception/Family Planning VISIT ENCOUNTER NOTE  Subjective:   Kirsten Hurley is a 31 y.o. SWF smoker G2P1011 (12 mo son) female here for reproductive life counseling. The patient is currently using Hormonal Implant to prevent pregnancy.  C/o bleeding with 2 menses/mo despite Ibuprofen 800 mg q 8 hours x 5 days. Menses 07/12/22-07/19/22 and 8/24-8/28/23; 22 days between menses in 06/2022 and light cramping when not bleeding. Nexplanon inserted 09/12/21 with PE. Last pap 01/17/21 neg. Last sex last night without condom. Smoking 1/2-1 ppd. LMP 07/27/22. Not working. Hasn't been to primary care MD for HTN f/u or for 2 hour pp.   The patient does not want a pregnancy in the next year.    Client states they are looking for the following:  High efficacy at preventing pregnancy  Denies abnormal vaginal bleeding, discharge, pelvic pain, problems with intercourse or other gynecologic concerns.    Gynecologic History Patient's last menstrual period was 07/27/2022 (exact date).  Health Maintenance Due  Topic Date Due   COVID-19 Vaccine (1) Never done   INFLUENZA VACCINE  07/04/2022     The following portions of the patient's history were reviewed and updated as appropriate: allergies, current medications, past family history, past medical history, past social history, past surgical history and problem list.  Review of Systems Pertinent items are noted in HPI.   Objective:  BP 132/85   Pulse 94   Resp 16   Ht 5\' 6"  (1.676 m)   Wt 286 lb (129.7 kg)   LMP 07/27/2022 (Exact Date)   BMI 46.16 kg/m  Gen: well appearing, NAD HEENT: no scleral icterus CV: RR Lung: Normal WOB Ext: warm well perfused    Assessment and Plan:   Need for emergency contraceptive care was assessed today.  Last unprotected sex was:  last night 08/08/22   Patient reported Unprotected sex within past  72 hours.  Reviewed options and patient desired No method of ECP, declined all    Contraception counseling: Reviewed methods in a patient centered fashion and used shared decision making with the patient. Utilized [employer] patient education tools as appropriate. The patient stated there goals and desires from a method are: High efficacy at preventing pregnancy  We reviewed the following methods in detail based on patient preferences available included: Hormonal Injection  Patient expressed they would like Hormonal Injection  This was provided to the patient today.  if not why not clearly documented  Risks, benefits, and typical effectiveness rates were reviewed.  Questions were answered.  Written information was also given to the patient to review.       will follow up in  11-13 weeks for surveillance.   was told to call with any further questions, or with any concerns about this method of contraception or cycle control.  Emphasized use of condoms 100% of the time for STI prevention.   1. Family planning Please give pt primary care MD list (needs for HTN) Please give pt 1800 quit # Counseled via 5 A's to stop smoking Pt needs apt for 2 hour pp please (gestational diabetic with pregnancy) Pt declines Plan B even though counseled about small risk of pregnancy with Nexplanon removal and initiation of DMPA today with unprotected sex last night Counsel pt to use back up condoms next 7 days Pt needs yearly physical 09/2022  - medroxyPROGESTERone (DEPO-PROVERA) injection 150 mg  2. Encounter for surveillance of implantable  subdermal contraceptive Nexplanon Removal Patient identified, informed consent performed, consent signed.   Appropriate time out taken. Nexplanon site identified.  Area prepped in usual sterile fashon. 3 ml of 1% lidocaine with Epinephrine was used to anesthetize the area at the distal end of the implant and along implant site. A small stab incision was made right beside  the implant on the distal portion.  The Nexplanon rod was grasped using curved hemostats/manual and removed without difficulty.  There was minimal blood loss. There were no complications.  Steri-strips were applied over the small incision.  A pressure bandage was applied to reduce any bruising.  The patient tolerated the procedure well and was given post procedure instructions.    Nexplanon:   Counseled patient to take OTC analgesic starting as soon as lidocaine starts to wear off and take regularly for at least 48 hr to decrease discomfort.  Specifically to take with food or milk to decrease stomach upset and for IB 600 mg (3 tablets) every 6 hrs; IB 800 mg (4 tablets) every 8 hrs; or Aleve 2 tablets every 12 hrs.      Please refer to After Visit Summary for other counseling recommendations.   Return for 11-13 wk DMPA.  Alberteen Spindle, CNM [facility] DEPARTMENT

## 2022-08-09 NOTE — Progress Notes (Addendum)
Patient here for acute FP visit for nexplanon removal and depo administration.   Last PAP: 01/17/21 - NILM   LMP: 07/27/22  Nexplanon inserted 09/12/21.  Last PE: 09/12/21 (PP visit). Patient had GM during pregnancy and 2hr gtt was not done during PP visit. Appt scheduled for 08/18/22 Friday at 0800 for 2hr gtt appt. Pt aware to not eat or drink past midnight.   Patient was not happy with nexplanon due to increase number of periods x2 monthly since insertion. Patient wishes to start depo today. She has tried depo when she was younger and states she had no issues other than weight gain.   Nexplanon removal consent signed. Depo consent signed. Education given and questions answered.   Depo administered today right deltoid. Tolerated well. Order x1 by Hazle Coca, CNM. Patient is due for physical and next depo around 10/25/22 - pt reminder card given.   PCP list given to patient to f/u wit PCP regarding elevated BPs.   Earlyne Iba, RN

## 2022-08-18 ENCOUNTER — Other Ambulatory Visit (LOCAL_COMMUNITY_HEALTH_CENTER): Payer: Medicaid Other

## 2022-08-18 ENCOUNTER — Other Ambulatory Visit: Payer: Medicaid Other

## 2022-08-18 DIAGNOSIS — O24419 Gestational diabetes mellitus in pregnancy, unspecified control: Secondary | ICD-10-CM

## 2022-08-18 NOTE — Progress Notes (Signed)
In nurse clinic for 2 hour GTT.  States received prenatal care here and had gestational diabetes. Recently had nexplanon removed and needed glucose test.    NPO since 10:30 pm yesterday.  Instructions given.    Says she is now on depo and has appt reminder to call for next depo injection and pe.    Cherlynn Polo, RN

## 2022-08-19 LAB — GLUCOSE TOLERANCE, 2 HOURS
Glucose, 2 hour: 85 mg/dL (ref 70–139)
Glucose, GTT - Fasting: 89 mg/dL (ref 70–99)

## 2022-08-24 IMAGING — US US MFM OB FOLLOW-UP
1 series · 14 of 23 positions shown · non-contrast
Comparison: none

[Series 1: us mfm ob follow-up · 14 of 23 slices shown]
[im 1/23]
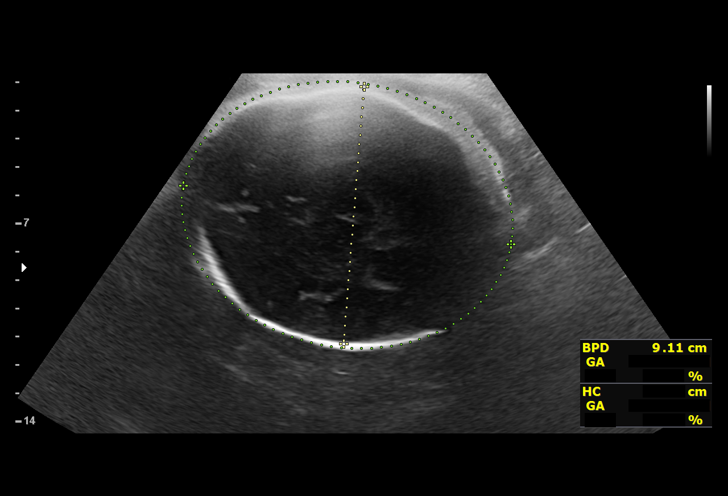
[im 3/23]
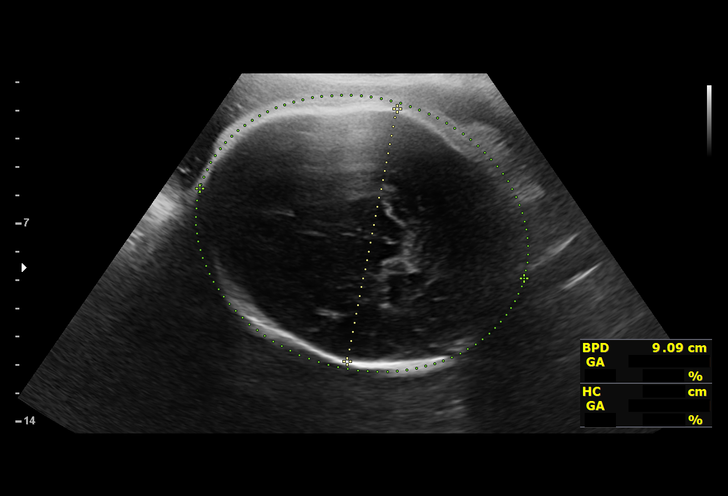
[im 5/23]
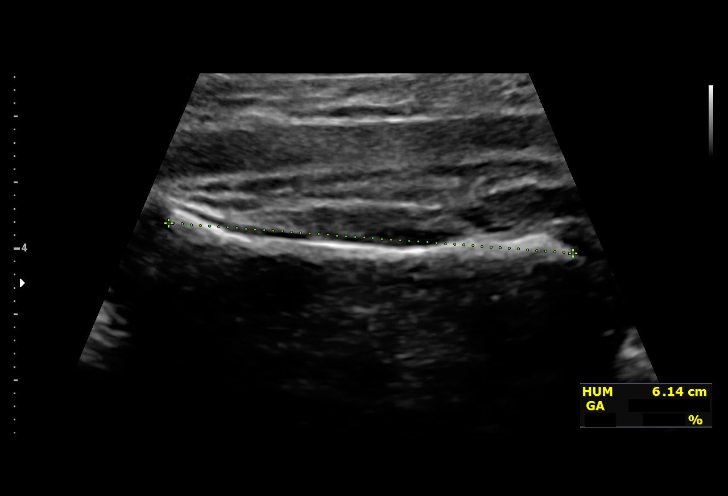
[im 6/23]
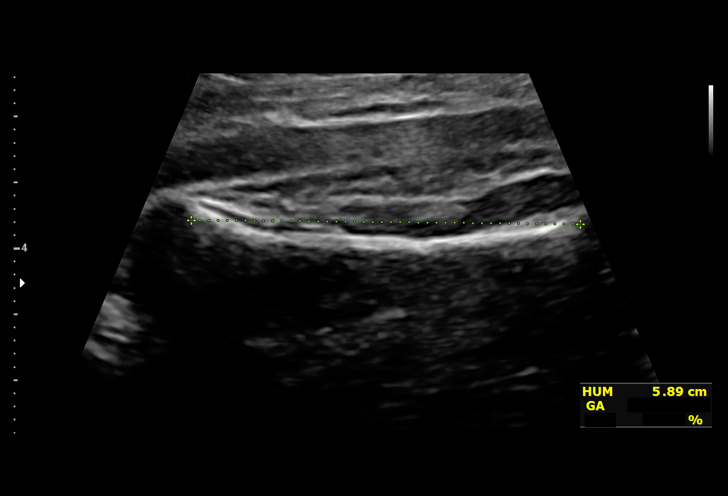
[im 8/23]
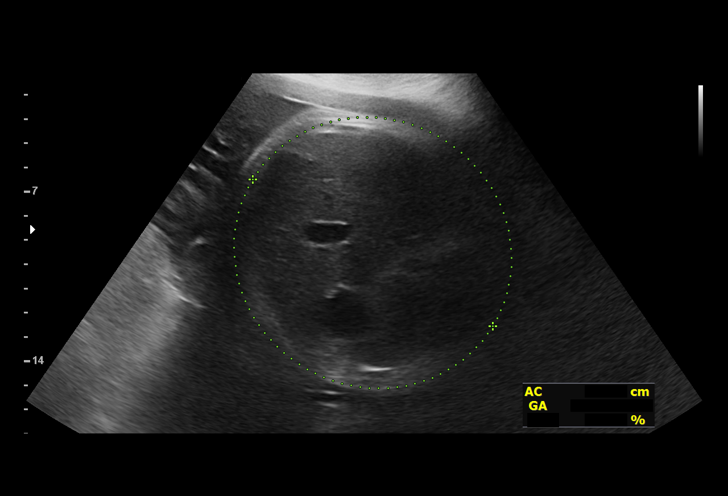
[im 10/23]
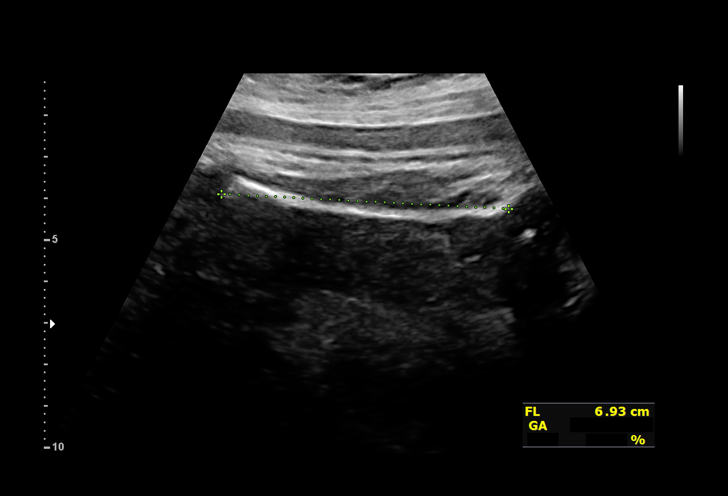
[im 11/23]
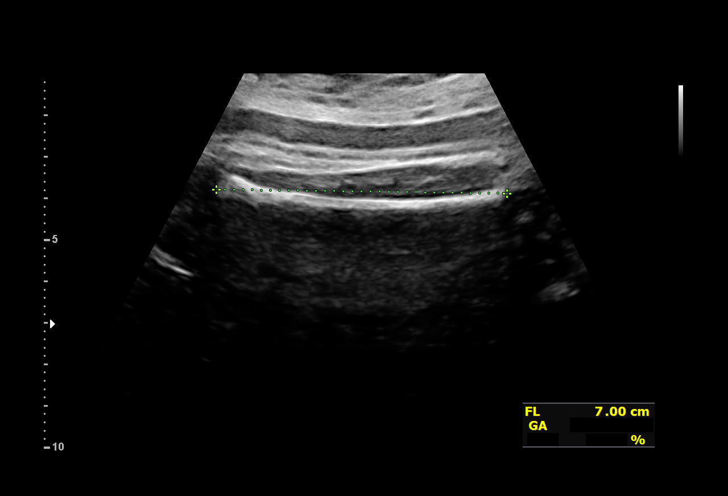
[im 13/23]
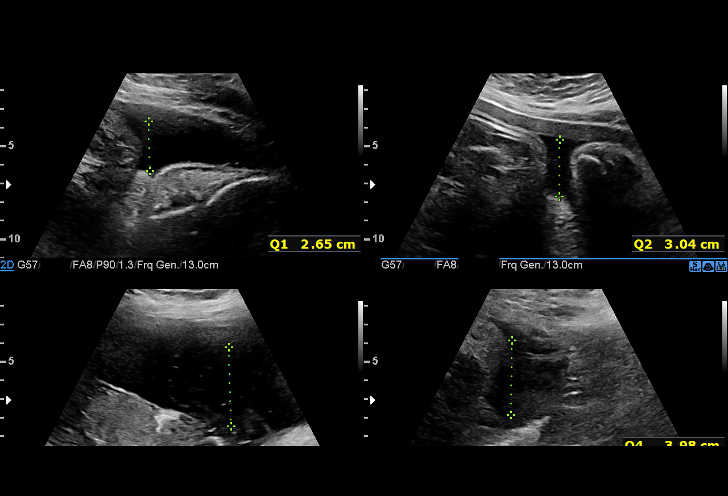
[im 14/23]
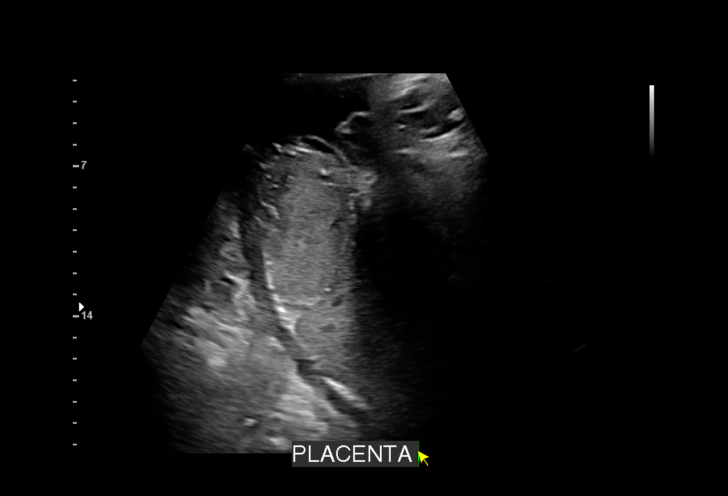
[im 16/23]
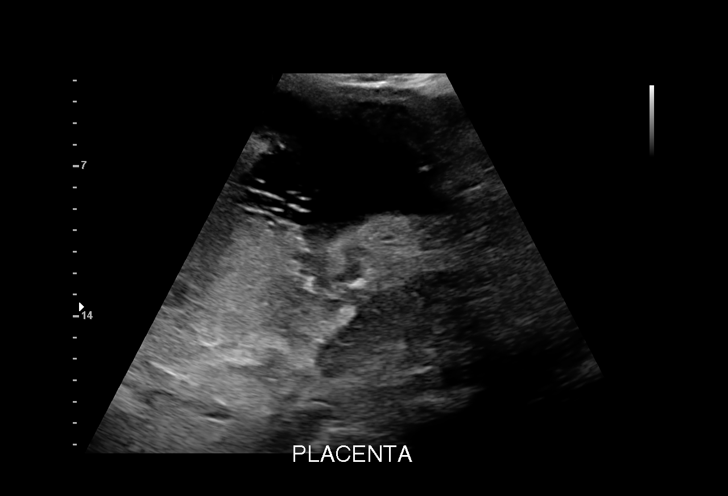
[im 18/23]
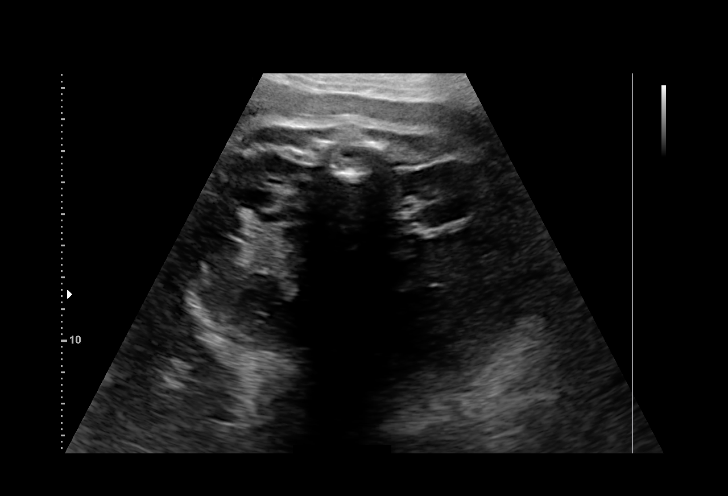
[im 19/23]
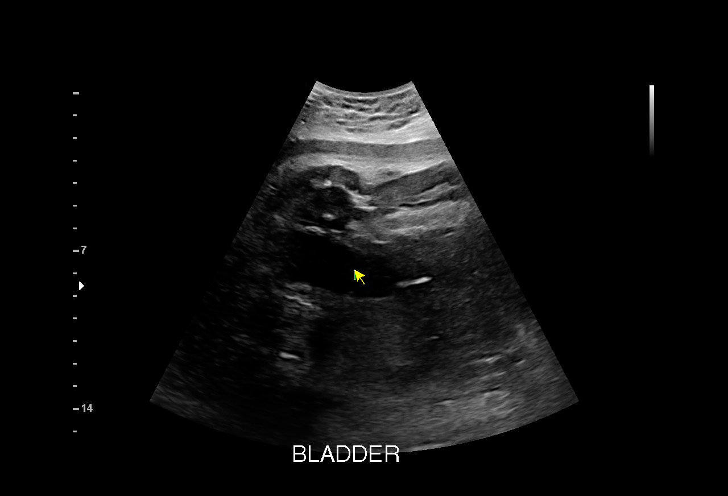
[im 21/23]
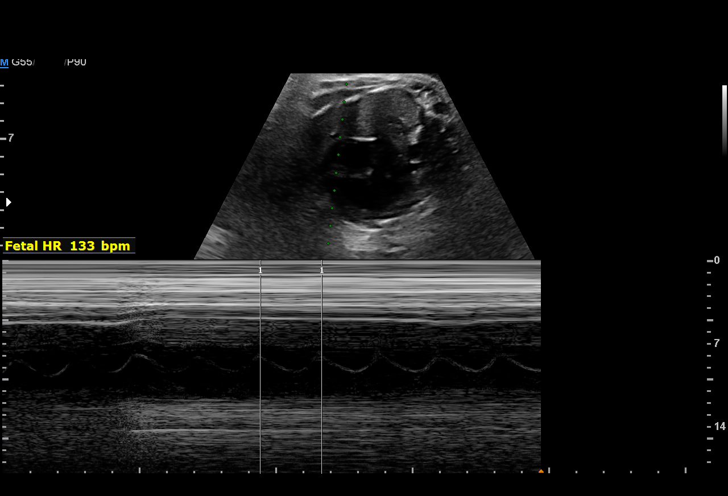
[im 23/23]
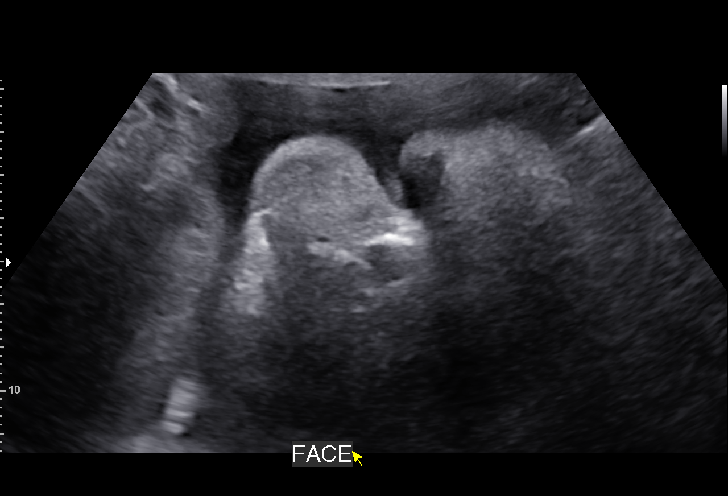

[14 of 23 positions shown; findings below may reference images not displayed]

SANGITA CNM

Indications

 Gestational diabetes in pregnancy, diet
 controlled
 Obesity complicating pregnancy, third
 trimester
 Uterine size-date discrepancy, third trimester
 37 weeks gestation of pregnancy
Fetal Evaluation

 Num Of Fetuses:          1
 Fetal Heart Rate(bpm):   133
 Cardiac Activity:        Observed
 Presentation:            Cephalic
 Placenta:                Posterior

 Amniotic Fluid
 AFI FV:      Within normal limits

 AFI Sum(cm)     %Tile       Largest Pocket(cm)
 13.9            52

 RUQ(cm)       RLQ(cm)       LUQ(cm)        LLQ(cm)
 2.7           4             3
Biometry

 BPD:        91  mm     G. Age:  36w 6d         54  %    CI:        73.98   %    70 - 86
                                                         FL/HC:       20.8  %    20.8 -
 HC:        336  mm     G. Age:  38w 3d         52  %    HC/AC:       0.95       0.92 -
 AC:      352.6  mm     G. Age:  39w 1d         96  %    FL/BPD:      76.8  %    71 - 87
 FL:       69.9  mm     G. Age:  35w 6d         14  %    FL/AC:       19.8  %    20 - 24
 HUM:      59.6  mm     G. Age:  34w 4d         18  %

 Est. FW:    6673   gm     7 lb 8 oz     75  %
OB History

 Gravidity:    2         Term:   0        Prem:   0        SAB:   1
 TOP:          0       Ectopic:  0        Living: 0
Gestational Age

 LMP:           37w 3d        Date:  10/18/20                 EDD:   07/25/21
 U/S Today:     37w 4d                                        EDD:   07/24/21
 Best:          37w 3d     Det. By:  LMP  (10/18/20)          EDD:   07/25/21
Anatomy

 Cranium:               Appears normal         Aortic Arch:            Previously seen
 Cavum:                 Appears normal         Ductal Arch:            Previously seen
 Ventricles:            Previously seen        Diaphragm:              Previously seen
 Choroid Plexus:        Previously seen        Stomach:                Appears normal, left
                                                                       sided
 Cerebellum:            Previously seen        Abdomen:                Appears normal
 Posterior Fossa:       Previously seen        Abdominal Wall:         Previously seen
 Nuchal Fold:           Previously seen        Cord Vessels:           Previously seen
 Face:                  Orbits and profile     Kidneys:                Appear normal
                        previously seen
 Lips:                  Previously seen        Bladder:                Appears normal
 Heart:                 Previously seen        Spine:                  Previously seen
 RVOT:                  Previously seen        Upper Extremities:      Previously seen
 LVOT:                  Previously seen        Lower Extremities:      Previously seen
Impression

 Follow up growth due to PJDBW
 Normal interval growth with measurements consistent with
 dates
 Good fetal movement and amniotic fluid volume
Recommendations

 Follow up as clinically indicated.

## 2022-09-28 ENCOUNTER — Telehealth: Payer: Self-pay | Admitting: Family Medicine

## 2022-09-28 NOTE — Telephone Encounter (Signed)
Since I change birth control method my period has been off. No period then I was spotting and brown blood.

## 2022-11-15 ENCOUNTER — Ambulatory Visit: Payer: Medicaid Other

## 2022-11-15 ENCOUNTER — Ambulatory Visit (LOCAL_COMMUNITY_HEALTH_CENTER): Payer: Medicaid Other | Admitting: Family Medicine

## 2022-11-15 ENCOUNTER — Encounter: Payer: Self-pay | Admitting: Family Medicine

## 2022-11-15 VITALS — BP 114/82 | Ht 66.0 in | Wt 270.0 lb

## 2022-11-15 DIAGNOSIS — Z0000 Encounter for general adult medical examination without abnormal findings: Secondary | ICD-10-CM

## 2022-11-15 DIAGNOSIS — R030 Elevated blood-pressure reading, without diagnosis of hypertension: Secondary | ICD-10-CM

## 2022-11-15 DIAGNOSIS — Z309 Encounter for contraceptive management, unspecified: Secondary | ICD-10-CM

## 2022-11-15 DIAGNOSIS — Z3042 Encounter for surveillance of injectable contraceptive: Secondary | ICD-10-CM

## 2022-11-15 DIAGNOSIS — F17200 Nicotine dependence, unspecified, uncomplicated: Secondary | ICD-10-CM

## 2022-11-15 DIAGNOSIS — E669 Obesity, unspecified: Secondary | ICD-10-CM

## 2022-11-15 MED ORDER — MEDROXYPROGESTERONE ACETATE 150 MG/ML IM SUSP
150.0000 mg | INTRAMUSCULAR | Status: AC
  Administered 2023-04-18 – 2023-10-04 (×3): 150 mg via INTRAMUSCULAR

## 2022-11-15 MED ORDER — MEDROXYPROGESTERONE ACETATE 150 MG/ML IM SUSP
150.0000 mg | Freq: Once | INTRAMUSCULAR | Status: DC
  Administered 2022-11-15: 150 mg via INTRAMUSCULAR

## 2022-11-15 NOTE — Progress Notes (Signed)
Pt. Seen in family planning clinic by FNP [facility]. Pt received Depo injection, and related education.

## 2022-11-15 NOTE — Progress Notes (Signed)
[facility] DEPARTMENT [facility] [address] Main Number: [phone]  Family Planning Visit- Repeat Yearly Visit  Subjective:  Kirsten Hurley is a 31 y.o. G2P1011  being seen today for an annual wellness visit and to discuss contraception options.   The patient is currently using Hormonal Injection for pregnancy prevention. She is 14 weeks post depo shot. Patient does not want a pregnancy in the next year.    report they are looking for a method that provides High efficacy at preventing pregnancy   Patient has the following medical problems: has Obesity BMI=43.58; Elevated blood pressure reading 06/21/22=141/91; 140/91= 09/12/21; Smoker 1/2 ppd; and Gestational diabetes history of on their problem list.  Chief Complaint  Patient presents with   Contraception    Depo will reschedule physical. Pt. Has lesion on breast and groin area (over a year, month and a half)    Patient reports to clinic for PE, depo and a breast concern.   See flowsheet for other program required questions.   Body mass index is 43.58 kg/m. - Patient is eligible for diabetes screening based on BMI and age >44?  not applicable HA1C ordered? not applicable  Patient reports 1 of partners in last year. Desires STI screening?  No - declined   Has patient been screened once for HCV in the past?  No  No results found for: "HCVAB"  Does the patient have current of drug use, have a partner with drug use, and/or has been incarcerated since last result? No  If yes-- Screen for HCV through [facility]   Does the patient meet criteria for HBV testing? No  Criteria:  -Household, sexual or needle sharing contact with HBV -History of drug use -HIV positive -Those with known Hep C   Health Maintenance Due  Topic Date Due   COVID-19 Vaccine (1) Never done   HPV VACCINES (2 - 3-dose series) 06/26/2008   INFLUENZA VACCINE  07/04/2022    Review of Systems  Constitutional:   Positive for weight loss.  Respiratory:  Positive for wheezing.   Neurological:  Positive for headaches.    The following portions of the patient's history were reviewed and updated as appropriate: allergies, current medications, past family history, past medical history, past social history, past surgical history and problem list. Problem list updated.  Objective:   Vitals:   11/15/22 1002  BP: 114/82  Weight: 270 lb (122.5 kg)  Height: 5\' 6"  (1.676 m)    Physical Exam Vitals and nursing note reviewed.  Constitutional:      Appearance: Normal appearance.  HENT:     Head: Normocephalic and atraumatic.     Mouth/Throat:     Mouth: Mucous membranes are moist.     Pharynx: Oropharynx is clear. No oropharyngeal exudate or posterior oropharyngeal erythema.  Pulmonary:     Effort: Pulmonary effort is normal.  Chest:  Breasts:    Tanner Score is 5.     Right: Normal. No mass, nipple discharge, skin change or tenderness.     Left: Normal. No mass, nipple discharge, skin change or tenderness.       Comments: Small acne like mark at 12 o'clock above left nipple. White head visible, raised, erythematous. Patient has another one of these located on her abdomen. Abdominal:     General: Abdomen is flat.     Palpations: There is no mass.     Tenderness: There is no abdominal tenderness. There is no rebound.  Genitourinary:    Comments: Declined genital exam.  Lymphadenopathy:     Head:     Right side of head: No preauricular or posterior auricular adenopathy.     Left side of head: No preauricular or posterior auricular adenopathy.     Cervical: No cervical adenopathy.     Upper Body:     Right upper body: No supraclavicular, axillary or epitrochlear adenopathy.     Left upper body: No supraclavicular, axillary or epitrochlear adenopathy.  Skin:    General: Skin is warm and dry.     Findings: No rash.  Neurological:     Mental Status: She is alert and oriented to person, place,  and time.     Assessment and Plan:  Kirsten Hurley is a 31 y.o. female G2P1011 presenting to the [facility] Department for an yearly wellness and contraception visit   Contraception counseling: Reviewed options based on patient desire and reproductive life plan. Patient is interested in Hormonal Injection. This was provided to the patient today.   Risks, benefits, and typical effectiveness rates were reviewed.  Questions were answered.  Written information was also given to the patient to review.    The patient will follow up in  3 months for depo shot.   The patient was told to call with any further questions, or with any concerns about this method of contraception.  Emphasized use of condoms 100% of the time for STI prevention.  Patient was assessed for need for ECP. Not indicated. Covered by depo.   1. Well woman exam (no gynecological exam)  -Last Pap 01/17/21 (NIL), patient due for Pap (01/18/24).  -CBE today- patient had concern about a small mark on her breast- this appears to be a small pimple/acne mark. Patient has another one located on her abdomen. Encouraged good hygiene, not to pick at the white-head, and keep this area dry. Patient had some worry about this d/t breast cancer in the family. This appears to be a non-concerning acne mark. -Patient c/o weight loss (~ 15 pounds) she is happy with this, states she is eating less. -Endorses wheezing, states this is due to her "fat body", BMI =43. I suspect this to improve with further weight loss, lungs clear today. Pt smokes daily. -Headaches occasionally- had had these "a long time" 2. Surveillance for Depo-Provera contraception  - medroxyPROGESTERone (DEPO-PROVERA) injection 150 mg  3. Elevated blood pressure reading 06/21/22=141/91; 140/91= 09/12/21 -Better BP today 114/82.  4. Obesity, unspecified classification, unspecified obesity type, unspecified whether serious comorbidity present -Patient lost about 15  pounds. Has been eating less. -Encouraged continued weight loss efforts.  5. Smoker 1/2 ppd Patient states she is not ready to talk about quitting today.   Return in about 1 year (around 11/16/2023), or if symptoms worsen or fail to improve.  Total time spent 30 minutes.  Lenice Llamas, [place]

## 2023-01-31 ENCOUNTER — Ambulatory Visit (LOCAL_COMMUNITY_HEALTH_CENTER): Payer: Medicaid Other

## 2023-01-31 VITALS — BP 136/89 | Ht 66.0 in | Wt 266.5 lb

## 2023-01-31 DIAGNOSIS — Z3009 Encounter for other general counseling and advice on contraception: Secondary | ICD-10-CM

## 2023-01-31 DIAGNOSIS — Z309 Encounter for contraceptive management, unspecified: Secondary | ICD-10-CM

## 2023-01-31 DIAGNOSIS — Z30013 Encounter for initial prescription of injectable contraceptive: Secondary | ICD-10-CM

## 2023-01-31 DIAGNOSIS — Z3042 Encounter for surveillance of injectable contraceptive: Secondary | ICD-10-CM

## 2023-01-31 MED ORDER — MEDROXYPROGESTERONE ACETATE 150 MG/ML IM SUSP
150.0000 mg | Freq: Once | INTRAMUSCULAR | Status: AC
Start: 2023-01-31 — End: 2023-01-31
  Administered 2023-01-31: 150 mg via INTRAMUSCULAR

## 2023-01-31 NOTE — Progress Notes (Signed)
11 weeks 0 Days post depo. Per pt, she has had 2 depo injections and has been experiencing irreg light vaginal bleeding with no cramping. Had bleeding lasting 2 weeks this month. Also reports mood swings. No hx depression or anxiety. Explains she has life stressors with baby and father of baby. Drinks energy drinks and smokes.   RN explained that irregular vaginal bleeding can occur for the first 3 to 4 depo injections and to adhere to 11 to 13 week intervals between injections for optimal benefit.  Consult Kirsten Buff, FNP who advises patient to increase self care efforts, such as healthy sleeping and eating habits and stopping energy drinks and smoking. If mood swings continue and are interfering with daily life, patient may want to consider changing bc methods.   RN discussed provider recommendations. Patient states she wants to continue depo today. Patient says she will try some lifestyle changes to decrease stress and assess if any improvement. Patient accepts A Kirsten Bond, LCSW contact card today. Questions answered and reports understanding. Kirsten Saunders, RN   Depo given today per order by Kirsten Buff, FNP today based on annual visit 11/15/2022. Tolerated well R delt. Next depo due 04/18/2023. Has reminder. Kirsten Saunders, RN

## 2023-04-18 ENCOUNTER — Ambulatory Visit (LOCAL_COMMUNITY_HEALTH_CENTER): Payer: Medicaid Other

## 2023-04-18 VITALS — BP 131/77 | Ht 66.0 in | Wt 256.5 lb

## 2023-04-18 DIAGNOSIS — Z309 Encounter for contraceptive management, unspecified: Secondary | ICD-10-CM

## 2023-04-18 DIAGNOSIS — Z3042 Encounter for surveillance of injectable contraceptive: Secondary | ICD-10-CM | POA: Diagnosis not present

## 2023-04-18 DIAGNOSIS — Z3009 Encounter for other general counseling and advice on contraception: Secondary | ICD-10-CM

## 2023-04-18 NOTE — Progress Notes (Signed)
11 weeks 0 days post depo.  Reports bleeding lasting one month, very light and no cramping.  Advised to adhere to 11 to 13 week interval between depo injections. Counseled that she may have irreg periods for first 3 to 4 injections.   Depo given today per order by Aliene Altes, FNP dated 11/15/2022. Tolerated well L delt. Next depo due 07/04/2023, has reminder. Jerel Shepherd, RN

## 2023-07-19 ENCOUNTER — Encounter: Payer: Self-pay | Admitting: Advanced Practice Midwife

## 2023-07-19 ENCOUNTER — Ambulatory Visit (LOCAL_COMMUNITY_HEALTH_CENTER): Payer: Medicaid Other | Admitting: Advanced Practice Midwife

## 2023-07-19 VITALS — BP 115/70 | HR 73 | Ht 65.0 in | Wt 253.6 lb

## 2023-07-19 DIAGNOSIS — Z30013 Encounter for initial prescription of injectable contraceptive: Secondary | ICD-10-CM | POA: Diagnosis not present

## 2023-07-19 DIAGNOSIS — Z3042 Encounter for surveillance of injectable contraceptive: Secondary | ICD-10-CM

## 2023-07-19 DIAGNOSIS — Z309 Encounter for contraceptive management, unspecified: Secondary | ICD-10-CM | POA: Diagnosis not present

## 2023-07-19 NOTE — Progress Notes (Signed)
Pt is here for Depo injection only Pt's last PE was 11/15/2022. Pt reports spotting with Depo. Depo 150 mg given IM in Rt deltoid.  Pt tolerated well. Reminder card to return in 11-13 weeks, for next Depo, was given to pt. Provider did not see patient.  Berdie Ogren, RN

## 2023-10-04 ENCOUNTER — Ambulatory Visit: Payer: Medicaid Other

## 2023-10-04 VITALS — BP 140/80 | Ht 65.0 in | Wt 248.5 lb

## 2023-10-04 DIAGNOSIS — Z3009 Encounter for other general counseling and advice on contraception: Secondary | ICD-10-CM

## 2023-10-04 DIAGNOSIS — Z309 Encounter for contraceptive management, unspecified: Secondary | ICD-10-CM

## 2023-10-04 DIAGNOSIS — Z30013 Encounter for initial prescription of injectable contraceptive: Secondary | ICD-10-CM

## 2023-10-04 DIAGNOSIS — Z3042 Encounter for surveillance of injectable contraceptive: Secondary | ICD-10-CM

## 2023-10-04 NOTE — Progress Notes (Signed)
11 weeks 0 days post depo. Depo given today per order by Aliene Altes, FNP dated 11/16/23. Tolerated well L delt. Plans to have annual PE when next depo is due, approx 12/20/2023.

## 2023-12-20 ENCOUNTER — Encounter: Payer: Self-pay | Admitting: Family Medicine

## 2023-12-20 ENCOUNTER — Ambulatory Visit: Payer: Medicaid Other | Admitting: Family Medicine

## 2023-12-20 VITALS — BP 134/86 | Ht 65.0 in | Wt 238.2 lb

## 2023-12-20 DIAGNOSIS — Z0000 Encounter for general adult medical examination without abnormal findings: Secondary | ICD-10-CM

## 2023-12-20 DIAGNOSIS — R030 Elevated blood-pressure reading, without diagnosis of hypertension: Secondary | ICD-10-CM

## 2023-12-20 DIAGNOSIS — N923 Ovulation bleeding: Secondary | ICD-10-CM

## 2023-12-20 DIAGNOSIS — Z720 Tobacco use: Secondary | ICD-10-CM | POA: Diagnosis not present

## 2023-12-20 DIAGNOSIS — E66812 Obesity, class 2: Secondary | ICD-10-CM

## 2023-12-20 DIAGNOSIS — Z309 Encounter for contraceptive management, unspecified: Secondary | ICD-10-CM | POA: Diagnosis not present

## 2023-12-20 DIAGNOSIS — Z3009 Encounter for other general counseling and advice on contraception: Secondary | ICD-10-CM

## 2023-12-20 DIAGNOSIS — F17200 Nicotine dependence, unspecified, uncomplicated: Secondary | ICD-10-CM

## 2023-12-20 MED ORDER — MEDROXYPROGESTERONE ACETATE 150 MG/ML IM SUSP
150.0000 mg | INTRAMUSCULAR | Status: AC
Start: 2023-12-20 — End: 2025-03-14
  Administered 2023-12-20 – 2024-09-01 (×4): 150 mg via INTRAMUSCULAR

## 2023-12-20 NOTE — Progress Notes (Signed)
[employer] HEALTH DEPARTMENT [facility] [address], [address] [place] [address] Main phone: [phone]  Family Planning Visit - Repeat Yearly Visit  Subjective:  Kirsten Hurley is a 33 y.o. G2P1011  being seen today for an annual wellness visit and to discuss contraception options.   The patient is currently using Hormonal Injection for pregnancy prevention. Patient does not want a pregnancy in the next year.   Patient reports they are looking for a method that provides High efficacy at preventing pregnancy  Patient has the following medical problems: has Obesity BMI=43.58; Elevated blood pressure reading 06/21/22=141/91; 140/91= 09/12/21; Smoker 1/2 ppd; and Gestational diabetes history of on their problem list.  Chief Complaint  Patient presents with   Contraception    PE and STD testing    HPI Patient reports to clinic for PE and depo.   Review of Systems  Constitutional:  Negative for weight loss.  Eyes:  Negative for blurred vision.  Respiratory:  Positive for shortness of breath. Negative for cough.   Cardiovascular:  Negative for claudication.  Gastrointestinal:  Negative for nausea.  Genitourinary:  Negative for dysuria and frequency.  Skin:  Negative for rash.  Neurological:  Positive for headaches.  Endo/Heme/Allergies:  Does not bruise/bleed easily.    See flowsheet for other program required questions.   Diabetes screening This patient is 33 y.o. with a BMI of Body mass index is 39.64 kg/m.Kirsten Hurley  Is patient eligible for diabetes screening (age >35 and BMI >25)?  no  Was Hgb A1c ordered? not applicable  STI screening Patient reports 1 of partners in last year.  Does this patient desire STI screening?  No - declined  Hepatitis C screening Has patient been screened once for HCV in the past?  No  No results found for: "HCVAB"  Does the patient meet criteria for HCV testing? No  (If yes-- Screen for HCV through [facility]  Lab) Criteria:  Since the last HCV result, does the patient have any of the following? - Current drug use - Have a partner with drug use - Has been incarcerated  Hepatitis B screening Does the patient meet criteria for HBV testing? No Criteria:  -Household, sexual or needle sharing contact with HBV -History of drug use -HIV positive -Those with known Hep C  Cervical Cancer Screening  No results found for: "DIAGPAP", "HPVHIGH", "ADEQPAP" Lab Results  Component Value Date   SPECADGYN Comment 01/17/2021   Result Date Procedure Results Follow-ups  01/17/2021 Pap IG (Image Guided) DIAGNOSIS:: Comment Specimen adequacy:: Comment Clinician Provided ICD10: Comment Performed by:: Comment QC reviewed by:: Comment PAP Smear Comment: . Note:: Comment Test Methodology: Comment     Health Maintenance Due  Topic Date Due   Pneumococcal Vaccine 40-65 Years old (1 of 2 - PCV) Never done   HPV VACCINES (2 - 3-dose series) 06/26/2008   INFLUENZA VACCINE  07/05/2023   COVID-19 Vaccine (1 - 2024-25 season) Never done   Cervical Cancer Screening (HPV/Pap Cotest)  01/18/2024    The following portions of the patient's history were reviewed and updated as appropriate: allergies, current medications, past family history, past medical history, past social history, past surgical history and problem list. Problem list updated.  Objective:   Vitals:   12/20/23 1323  BP: 134/86  Weight: 238 lb 3.2 oz (108 kg)  Height: 5\' 5"  (1.651 m)    Physical Exam Vitals and nursing note reviewed.  Constitutional:      Appearance: She  is obese.  HENT:     Head: Normocephalic and atraumatic.  Pulmonary:     Effort: Pulmonary effort is normal.  Abdominal:     Palpations: Abdomen is soft.  Musculoskeletal:        General: Normal range of motion.  Skin:    General: Skin is warm and dry.  Neurological:     General: No focal deficit present.     Mental Status: She is alert.  Psychiatric:         Mood and Affect: Mood normal.        Behavior: Behavior normal.     Assessment and Plan:  [name] is a 33 y.o. female G2P1011 presenting to the [facility] Department for an yearly wellness and contraception visit  1. Family planning (Primary) Contraception counseling: Reviewed options based on patient desire and reproductive life plan. Patient is interested in Hormonal Injection. This was provided to the patient today.  Risks, benefits, and typical effectiveness rates were reviewed.  Questions were answered.  Written information was also given to the patient to review.    The patient will follow up in  3 months for surveillance.  The patient was told to call with any further questions, or with any concerns about this method of contraception.  Emphasized use of condoms 100% of the time for STI prevention.  Educated on ECP and assessed need for ECP. Not indicated   - medroxyPROGESTERone (DEPO-PROVERA) injection 150 mg  2. Well woman exam (no gynecological exam) -CBE done 11/2022 (normal), next due 11/2025 -reports occasional SOB and some discomfort in right chest when smoking, encouraged to stop smoking  -reports HA that are 1-2x/week, relieved with OTC meds -due for repeat pap in Feb, offered to do today, pt declined due to vaginal bleeding, will return in about 1 month  3. Elevated blood pressure reading 06/21/22=141/91; 140/91= 09/12/21 -Pt likely has chronic HTN -no PCP at this time, given list for PCP in community  4. Smoker 1/2 ppd -reviewed recommendation to quit smoking -has cut down from 2ppd to 1/2ppd -reviewed resources like quit line to get connected with counseling and medication assistance  5. Class 2 obesity with body mass index (BMI) of 39.0 to 39.9 in adult, unspecified obesity type, unspecified whether serious comorbidity present -patient has lost approx 40# since last visit -congratulated on weight loss and encouraged to continue  6. Vaginal  bleeding between periods -reports spotting and light bleeding since December 16 -pt requesting OCPs to regulate her bleeding -I reviewed that due to her HTN, smoking, and obesity she is at increased risk for blood clots, and I do not recommend OCPs to be added to her depo at this time -I reviewed that she can trial high dose ibuprofen 800mg  TID x 5 days to see if this stops the bleeding -pt reports she doesn't have funds for ibuprofen right now but will get it soon to try it -will RTC in 1 month for pap smear and will revisit pts bleeding pattern at this time    Return in about 3 months (around 03/19/2024) for depo injection.  No future appointments.  Lenice Llamas, [place]

## 2023-12-20 NOTE — Progress Notes (Signed)
Pt is here for PE and Depo injection.  Pt declines STD testing today. Depo 150 mg given IM in Rt deltoid.  Pt tolerated well. FP packet and reminder card to return in 11-13 weeks given to pt.  Berdie Ogren, RN

## 2024-03-20 ENCOUNTER — Ambulatory Visit

## 2024-03-20 VITALS — BP 137/84 | Ht 65.0 in | Wt 237.5 lb

## 2024-03-20 DIAGNOSIS — Z309 Encounter for contraceptive management, unspecified: Secondary | ICD-10-CM

## 2024-03-20 DIAGNOSIS — Z30013 Encounter for initial prescription of injectable contraceptive: Secondary | ICD-10-CM | POA: Diagnosis not present

## 2024-03-20 DIAGNOSIS — Z3042 Encounter for surveillance of injectable contraceptive: Secondary | ICD-10-CM

## 2024-03-20 DIAGNOSIS — Z3009 Encounter for other general counseling and advice on contraception: Secondary | ICD-10-CM

## 2024-03-20 NOTE — Progress Notes (Signed)
 13w 0d post depo. Voices no concerns. Depo given today per order by Fain Home, FNP dated 12/20/2023. Tolerated well in L deltoid. Next depo due 06/05/2024; patient has reminder.  Clare Critchley, RN

## 2024-06-09 ENCOUNTER — Ambulatory Visit

## 2024-06-13 ENCOUNTER — Ambulatory Visit (LOCAL_COMMUNITY_HEALTH_CENTER)

## 2024-06-13 VITALS — BP 145/86 | Ht 66.0 in | Wt 233.0 lb

## 2024-06-13 DIAGNOSIS — Z30013 Encounter for initial prescription of injectable contraceptive: Secondary | ICD-10-CM

## 2024-06-13 DIAGNOSIS — Z3042 Encounter for surveillance of injectable contraceptive: Secondary | ICD-10-CM

## 2024-06-13 DIAGNOSIS — Z309 Encounter for contraceptive management, unspecified: Secondary | ICD-10-CM

## 2024-06-13 DIAGNOSIS — Z3009 Encounter for other general counseling and advice on contraception: Secondary | ICD-10-CM

## 2024-06-13 NOTE — Progress Notes (Signed)
 12 weeks  and 1 day post Depo.  Client reports she had about 4-5 weeks of bleeding, in Mid May- June.  Light, but lasted a long time.  She states that she is satisfied with pregnancy prevention, but doesn't love that she gets a period.  We used the Upstream wheel to talk about other methods.  At this time she doesn't wish to make a change in her method.  She states she will not remember to take the Pill, and is not interested in IUD.   She also reported she has been having some breast tenderness, and pain behind her ear.  Client encouraged to establish care with PCP.   List of local providers given to client.    Depo given today as ordered by HILARIO Bers, FNP, dated 12/20/2023.  Tolerated well Right Deltoid.  Next Depo Due 08/29/2024  Client has reminder card.

## 2024-09-01 ENCOUNTER — Ambulatory Visit

## 2024-09-01 ENCOUNTER — Encounter: Payer: Self-pay | Admitting: Physician Assistant

## 2024-09-01 ENCOUNTER — Ambulatory Visit: Admitting: Physician Assistant

## 2024-09-01 VITALS — BP 137/94 | Temp 98.4°F | Wt 234.4 lb

## 2024-09-01 DIAGNOSIS — Z3042 Encounter for surveillance of injectable contraceptive: Secondary | ICD-10-CM

## 2024-09-01 DIAGNOSIS — Z3009 Encounter for other general counseling and advice on contraception: Secondary | ICD-10-CM

## 2024-09-01 DIAGNOSIS — Z30013 Encounter for initial prescription of injectable contraceptive: Secondary | ICD-10-CM | POA: Diagnosis not present

## 2024-09-01 DIAGNOSIS — Z309 Encounter for contraceptive management, unspecified: Secondary | ICD-10-CM | POA: Diagnosis not present

## 2024-09-01 NOTE — Progress Notes (Signed)
   [facility] Problem Visit  [facility]  Subjective:  Kirsten Hurley is a 33 y.o. being seen today for   Chief Complaint  Patient presents with   Contraception    Pt here for routine DMPA and Pap only.     Does the patient have a current or past history of drug use? No   No components found for: HCV]   Health Maintenance Due  Topic Date Due   HPV VACCINES (2 - 3-dose series) 06/26/2008   Pneumococcal Vaccine (1 of 2 - PCV) Never done   Hepatitis B Vaccines 19-59 Average Risk (1 of 3 - 19+ 3-dose series) Never done   Cervical Cancer Screening (HPV/Pap Cotest)  01/18/2024   Influenza Vaccine  07/04/2024   COVID-19 Vaccine (1 - 2024-25 season) Never done    Review of Systems  Constitutional: Negative.   HENT: Negative.    Eyes: Negative.   Respiratory: Negative.    Cardiovascular: Negative.   Gastrointestinal: Negative.   Genitourinary: Negative.   Musculoskeletal: Negative.   Skin: Negative.   Neurological: Negative.   Endo/Heme/Allergies: Negative.   Psychiatric/Behavioral: Negative.      The following portions of the patient's history were reviewed and updated as appropriate: allergies, current medications, past family history, past medical history, past social history, past surgical history and problem list. Problem list updated.   See flowsheet for other program required questions.  Objective:   Vitals:   09/01/24 1431 09/01/24 1446  BP: (!) 154/93 (!) 137/94  Temp: 98.4 F (36.9 C)   Weight: 234 lb 6.4 oz (106.3 kg)     Physical Exam Exam conducted with a chaperone present (Alexia Parker, [facility]).  Genitourinary:    General: Normal vulva.     Exam position: Lithotomy position.     Pubic Area: No rash.      Labia:        Right: No rash, tenderness or lesion.        Left: No rash, tenderness or lesion.      Vagina: No vaginal discharge.     Cervix: Normal.     Uterus: Normal.      Adnexa:        Right:  No tenderness.         Left: No tenderness.       Rectum: No external hemorrhoid.  Lymphadenopathy:     Lower Body: No right inguinal adenopathy. No left inguinal adenopathy.     Assessment and Plan:  Kirsten Hurley is a 33 y.o. female presenting to the [facility] Department for a Women's Health problem visit  1. Family planning services (Primary) Routine cervical cancer screen via co-testing today. - IGP, Aptima HPV  2. Surveillance for Depo-Provera  contraception Continue DMPA per 12/2023 standing order.   Return in about 3 months (around 12/01/2024) for Routine DMPA injection.  No future appointments.  Donda Friedli, PA-C

## 2024-09-04 ENCOUNTER — Ambulatory Visit: Payer: Self-pay | Admitting: Physician Assistant

## 2024-09-04 LAB — IGP, APTIMA HPV
HPV Aptima: NEGATIVE
PAP Smear Comment: 0
